# Patient Record
Sex: Female | Born: 1960
Health system: Southern US, Community
[De-identification: ages and names within clinical notes are randomized; demographics above are authoritative.]

## PROBLEM LIST (undated history)

## (undated) DIAGNOSIS — Z87440 Personal history of urinary (tract) infections: Secondary | ICD-10-CM

## (undated) DIAGNOSIS — Z8719 Personal history of other diseases of the digestive system: Secondary | ICD-10-CM

## (undated) DIAGNOSIS — IMO0002 Reserved for concepts with insufficient information to code with codable children: Secondary | ICD-10-CM

## (undated) HISTORY — DX: Personal history of other diseases of the digestive system: Z87.19

## (undated) HISTORY — DX: Reserved for concepts with insufficient information to code with codable children: IMO0002

## (undated) HISTORY — PX: LAPAROSCOPY: SHX197

## (undated) HISTORY — DX: Personal history of urinary (tract) infections: Z87.440

## (undated) HISTORY — PX: WISDOM TOOTH EXTRACTION: SHX21

---

## 1995-08-14 HISTORY — PX: ABDOMINAL HYSTERECTOMY: SHX81

## 1999-11-01 ENCOUNTER — Encounter: Admission: RE | Admit: 1999-11-01 | Discharge: 1999-11-01 | Payer: Self-pay | Admitting: *Deleted

## 2000-07-15 ENCOUNTER — Encounter: Admission: RE | Admit: 2000-07-15 | Discharge: 2000-07-15 | Payer: Self-pay | Admitting: *Deleted

## 2005-09-17 ENCOUNTER — Ambulatory Visit: Payer: Self-pay | Admitting: Family Medicine

## 2006-04-19 ENCOUNTER — Other Ambulatory Visit: Admission: RE | Admit: 2006-04-19 | Discharge: 2006-04-19 | Payer: Self-pay | Admitting: Internal Medicine

## 2006-11-19 ENCOUNTER — Other Ambulatory Visit: Admission: RE | Admit: 2006-11-19 | Discharge: 2006-11-19 | Payer: Self-pay | Admitting: Internal Medicine

## 2007-05-16 ENCOUNTER — Other Ambulatory Visit: Admission: RE | Admit: 2007-05-16 | Discharge: 2007-05-16 | Payer: Self-pay | Admitting: Internal Medicine

## 2009-08-13 HISTORY — PX: KNEE SURGERY: SHX244

## 2010-02-24 ENCOUNTER — Ambulatory Visit: Payer: Self-pay | Admitting: Family Medicine

## 2010-05-10 ENCOUNTER — Other Ambulatory Visit: Admission: RE | Admit: 2010-05-10 | Discharge: 2010-05-10 | Payer: Self-pay | Admitting: Internal Medicine

## 2011-02-19 ENCOUNTER — Ambulatory Visit: Payer: Self-pay | Admitting: Family Medicine

## 2011-07-23 ENCOUNTER — Ambulatory Visit: Payer: Self-pay | Admitting: Family Medicine

## 2012-01-24 ENCOUNTER — Ambulatory Visit: Payer: Self-pay | Admitting: Family Medicine

## 2012-08-19 ENCOUNTER — Ambulatory Visit: Payer: Self-pay | Admitting: Family Medicine

## 2012-08-20 ENCOUNTER — Ambulatory Visit: Payer: Self-pay | Admitting: Family Medicine

## 2012-10-09 ENCOUNTER — Encounter: Payer: Self-pay | Admitting: Internal Medicine

## 2012-10-09 LAB — HM PAP SMEAR

## 2012-11-19 ENCOUNTER — Encounter: Payer: Self-pay | Admitting: Internal Medicine

## 2012-12-04 ENCOUNTER — Ambulatory Visit (AMBULATORY_SURGERY_CENTER): Payer: 59 | Admitting: *Deleted

## 2012-12-04 VITALS — Ht 62.0 in | Wt 132.8 lb

## 2012-12-04 DIAGNOSIS — Z1211 Encounter for screening for malignant neoplasm of colon: Secondary | ICD-10-CM

## 2012-12-04 MED ORDER — MOVIPREP 100 G PO SOLR: 1.0000 | Freq: Once | ORAL | Status: DC

## 2012-12-04 NOTE — Progress Notes (Signed)
NO EGG OR SOY ALLERGY. EWM PT STATES OCCASIONALLY HAS TROUBLE WAKING FROM SEDATION AND SHE CRIES WITH SEDATION AS WELL. EWM

## 2012-12-18 ENCOUNTER — Ambulatory Visit (AMBULATORY_SURGERY_CENTER): Payer: 59 | Admitting: Internal Medicine

## 2012-12-18 ENCOUNTER — Encounter: Payer: Self-pay | Admitting: Internal Medicine

## 2012-12-18 VITALS — BP 101/63 | HR 55 | Temp 97.1°F | Resp 13 | Ht 62.0 in | Wt 132.0 lb

## 2012-12-18 DIAGNOSIS — Z1211 Encounter for screening for malignant neoplasm of colon: Secondary | ICD-10-CM

## 2012-12-18 MED ORDER — SODIUM CHLORIDE 0.9 % IV SOLN: 500.0000 mL | INTRAVENOUS | Status: DC

## 2012-12-18 NOTE — Progress Notes (Signed)
Patient did not experience any of the following events: a burn prior to discharge; a fall within the facility; wrong site/side/patient/procedure/implant event; or a hospital transfer or hospital admission upon discharge from the facility. (G8907) Patient did not have preoperative order for IV antibiotic SSI prophylaxis. (G8918)  

## 2012-12-18 NOTE — Op Note (Signed)
[facility] [address] [place], [address]   COLONOSCOPY PROCEDURE REPORT  PATIENT: Christal, Lagerstrom  MR#: [id number] BIRTHDATE: [birth year] , 51  yrs. old GENDER: Female ENDOSCOPIST: Beverley Fiedler, MD REFERRED BY: PROCEDURE DATE:  12/18/2012 PROCEDURE:   Colonoscopy, screening ASA CLASS:   Class I INDICATIONS:average risk screening and first colonoscopy. MEDICATIONS: MAC sedation, administered by CRNA and propofol (Diprivan) 300mg  IV  DESCRIPTION OF PROCEDURE:   After the risks benefits and alternatives of the procedure were thoroughly explained, informed consent was obtained.  A digital rectal exam revealed no rectal mass.   The LB [id number]-[id number]  endoscope was introduced through the anus and advanced to the cecum, which was identified by both the appendix and ileocecal valve. No adverse events experienced. The quality of the prep was good, using MoviPrep  The instrument was then slowly withdrawn as the colon was fully examined.   COLON FINDINGS: There was mild scattered diverticulosis noted in the descending colon and sigmoid colon.   The colon was otherwise normal.  There was no inflammation, polyps or cancers seen. Retroflexed views revealed small internal hemorrhoids. The time to cecum=1 minutes 41 seconds.  Withdrawal time=9 minutes 02 seconds. The scope was withdrawn and the procedure completed.  COMPLICATIONS: There were no complications.  ENDOSCOPIC IMPRESSION: 1.   There was mild diverticulosis noted in the descending colon and sigmoid colon 2.   The colon was otherwise normal 3.   Small internal hemorrhoids  RECOMMENDATIONS: 1.  High fiber diet 2.  You should continue to follow colorectal cancer screening guidelines for "routine risk" patients with a repeat colonoscopy in 10 years.  There is no need for FOBT (stool) testing for at least 5 years.   eSigned:  Beverley Fiedler, MD 12/18/2012 9:27 AM   cc: The Patient

## 2012-12-18 NOTE — Progress Notes (Signed)
Report to pacu rn vss, bbs=clear 

## 2012-12-18 NOTE — Progress Notes (Signed)
Scope changed 571 to 0528 colon.

## 2012-12-18 NOTE — Patient Instructions (Addendum)
YOU HAD AN ENDOSCOPIC PROCEDURE TODAY AT THE [facility]: Refer to the procedure report that was given to you for any specific questions about what was found during the examination.  If the procedure report does not answer your questions, please call your gastroenterologist to clarify.  If you requested that your care partner not be given the details of your procedure findings, then the procedure report has been included in a sealed envelope for you to review at your convenience later.  YOU SHOULD EXPECT: Some feelings of bloating in the abdomen. Passage of more gas than usual.  Walking can help get rid of the air that was put into your GI tract during the procedure and reduce the bloating. If you had a lower endoscopy (such as a colonoscopy or flexible sigmoidoscopy) you may notice spotting of blood in your stool or on the toilet paper. If you underwent a bowel prep for your procedure, then you may not have a normal bowel movement for a few days.  DIET: Your first meal following the procedure should be a light meal and then it is ok to progress to your normal diet.  A half-sandwich or bowl of soup is an example of a good first meal.  Heavy or fried foods are harder to digest and may make you feel nauseous or bloated.  Likewise meals heavy in dairy and vegetables can cause extra gas to form and this can also increase the bloating.  Drink plenty of fluids but you should avoid alcoholic beverages for 24 hours.  ACTIVITY: Your care partner should take you home directly after the procedure.  You should plan to take it easy, moving slowly for the rest of the day.  You can resume normal activity the day after the procedure however you should NOT DRIVE or use heavy machinery for 24 hours (because of the sedation medicines used during the test).    SYMPTOMS TO REPORT IMMEDIATELY: A gastroenterologist can be reached at any hour.  During normal business hours, 8:30 AM to 5:00 PM Monday through Friday,  call [phone].  After hours and on weekends, please call the GI answering service at [phone] who will take a message and have the physician on call contact you.   Following lower endoscopy (colonoscopy or flexible sigmoidoscopy):  Excessive amounts of blood in the stool  Significant tenderness or worsening of abdominal pains  Swelling of the abdomen that is new, acute  Fever of 100F or higher   FOLLOW UP: If any biopsies were taken you will be contacted by phone or by letter within the next 1-3 weeks.  Call your gastroenterologist if you have not heard about the biopsies in 3 weeks.  Our staff will call the home number listed on your records the next business day following your procedure to check on you and address any questions or concerns that you may have at that time regarding the information given to you following your procedure. This is a courtesy call and so if there is no answer at the home number and we have not heard from you through the emergency physician on call, we will assume that you have returned to your regular daily activities without incident.  SIGNATURES/CONFIDENTIALITY: You and/or your care partner have signed paperwork which will be entered into your electronic medical record.  These signatures attest to the fact that that the information above on your After Visit Summary has been reviewed and is understood.  Full responsibility of the confidentiality of   this discharge information lies with you and/or your care-partner.  Diverticulosis, high fiber diet, hemorrhoids-handouts given  Repeat colonoscopy in 10 years.  

## 2012-12-19 ENCOUNTER — Telehealth: Payer: Self-pay | Admitting: *Deleted

## 2012-12-19 NOTE — Telephone Encounter (Signed)
  Follow up Call-  Call back number 12/18/2012  Post procedure Call Back phone  # [phone]  Permission to leave phone message Yes     Patient questions:  Do you have a fever, pain , or abdominal swelling? no Pain Score  0 *  Have you tolerated food without any problems? yes  Have you been able to return to your normal activities? yes  Do you have any questions about your discharge instructions: Diet   no Medications  no Follow up visit  no  Do you have questions or concerns about your Care? no  Actions: * If pain score is 4 or above: No action needed, pain <4.

## 2013-04-23 ENCOUNTER — Ambulatory Visit: Payer: 59 | Admitting: Internal Medicine

## 2013-06-17 ENCOUNTER — Encounter: Payer: Self-pay | Admitting: Internal Medicine

## 2013-06-17 ENCOUNTER — Ambulatory Visit (INDEPENDENT_AMBULATORY_CARE_PROVIDER_SITE_OTHER): Payer: BC Managed Care – PPO | Admitting: Internal Medicine

## 2013-06-17 VITALS — BP 100/70 | HR 63 | Temp 98.1°F | Ht 62.75 in | Wt 135.0 lb

## 2013-06-17 DIAGNOSIS — Z13220 Encounter for screening for lipoid disorders: Secondary | ICD-10-CM

## 2013-06-17 DIAGNOSIS — K5790 Diverticulosis of intestine, part unspecified, without perforation or abscess without bleeding: Secondary | ICD-10-CM

## 2013-06-17 DIAGNOSIS — Z23 Encounter for immunization: Secondary | ICD-10-CM

## 2013-06-17 DIAGNOSIS — M549 Dorsalgia, unspecified: Secondary | ICD-10-CM

## 2013-06-17 DIAGNOSIS — N809 Endometriosis, unspecified: Secondary | ICD-10-CM

## 2013-06-17 DIAGNOSIS — F439 Reaction to severe stress, unspecified: Secondary | ICD-10-CM

## 2013-06-17 DIAGNOSIS — Z733 Stress, not elsewhere classified: Secondary | ICD-10-CM

## 2013-06-17 DIAGNOSIS — K5730 Diverticulosis of large intestine without perforation or abscess without bleeding: Secondary | ICD-10-CM

## 2013-06-17 DIAGNOSIS — N951 Menopausal and female climacteric states: Secondary | ICD-10-CM

## 2013-06-17 NOTE — Progress Notes (Signed)
Pre-visit discussion using our clinic review tool. No additional management support is needed unless otherwise documented below in the visit note.  

## 2013-06-21 ENCOUNTER — Encounter: Payer: Self-pay | Admitting: Internal Medicine

## 2013-06-21 ENCOUNTER — Other Ambulatory Visit: Payer: Self-pay | Admitting: Internal Medicine

## 2013-06-21 DIAGNOSIS — F439 Reaction to severe stress, unspecified: Secondary | ICD-10-CM | POA: Insufficient documentation

## 2013-06-21 DIAGNOSIS — M549 Dorsalgia, unspecified: Secondary | ICD-10-CM | POA: Insufficient documentation

## 2013-06-21 DIAGNOSIS — K5790 Diverticulosis of intestine, part unspecified, without perforation or abscess without bleeding: Secondary | ICD-10-CM | POA: Insufficient documentation

## 2013-06-21 DIAGNOSIS — N809 Endometriosis, unspecified: Secondary | ICD-10-CM | POA: Insufficient documentation

## 2013-06-21 DIAGNOSIS — N951 Menopausal and female climacteric states: Secondary | ICD-10-CM | POA: Insufficient documentation

## 2013-06-21 MED ORDER — ESTRADIOL 1.53 MG/SPRAY TD SOLN: 2.0000 | Freq: Every day | TRANSDERMAL | Status: DC

## 2013-06-21 NOTE — Assessment & Plan Note (Signed)
Previous MRI as outlined.  Stable.  Exercises and stretches.  Follow.

## 2013-06-21 NOTE — Progress Notes (Signed)
Subjective:    Patient ID: Erica Robinson, female    DOB: 1961/03/02, 52 y.o.   MRN: [id number]  HPI 52 year old female with past history of back pain (with L-5 - bulging disc), diverticulosis and endometriosis.  She comes in today to follow up on these issues as well as to establish care.  She has previously been seeing Thurston Pounds at [facility].  Also previously worked at [employer] and was evaluated there.  She now works at [employer] in [place].  Stress is better.  No acid reflux.  Does have some problems with her left side/leg - being numb.  Had MRI of her back.  Revealed bulging disc at L5.  Has seen a chiropractor.  Has a "decompression" every 3-6 months.  Overall stable.  Controls with exercise/stretching.  Notices some intermittent left lower extremity swelling.  No weakness of the leg.  Eating and drinking well.  Plans to get back in to her exercise routine.  Has been taking care of her mother.  She recently passed away.  Feels she is coping relatively well.  Some issues with postmenopausal issues.  Did not tolerate oral estrogen or the patch.  She is on the evamist now and tolerating.  We discussed her family history of breast cancer and ovarian cancer.  Discussed risks and side effects of estrogen therapy.  She is trying to titrate down her estrogen.  Overall she feel she is doing relatively well.     Past Medical History  Diagnosis Date  . Hx of diverticulitis of colon   . Hx: UTI (urinary tract infection)   . Bulging disc     L5    Outpatient Encounter Prescriptions as of 06/17/2013  Medication Sig  . Calcium-Vitamin D-Vitamin K (CHEWABLE CALCIUM PO) Take 1,000 mg by mouth daily.  . Cholecalciferol (VITAMIN D-3 PO) Take 5,000 Units by mouth 2 (two) times a week.  . estradiol (EVAMIST) 1.53 MG/SPRAY transdermal spray Place 2 sprays onto the skin daily.  . Multiple Vitamins-Minerals (MULTIVITAMIN PO) Take 1 capsule by mouth daily.  Marland Kitchen VITAMIN E  PO Take 1 capsule by mouth daily.    Review of Systems Patient denies any headache, lightheadedness or dizziness.  No significant sinus or allergy symptoms.  No chest pain, tightness or palpitations.  No increased shortness of breath, cough or congestion.  No nausea or vomiting.  No abdominal pain or cramping.  No bowel change, such as diarrhea, constipation, BRBPR or melana.  No urine change.  Stress is better.  Some post menopausal symptoms.  Some hot flashes.  Better on evamist.  Left side pain and numbness as outlined.  Back issues as outlined.         Objective:   Physical Exam Filed Vitals:   06/17/13 0824  BP: 100/70  Pulse: 63  Temp: 98.1 F (6.10 C)   52 year old female in no acute distress.   HEENT:  Nares- clear.  Oropharynx - without lesions. NECK:  Supple.  Nontender.  No audible bruit.  HEART:  Appears to be regular. LUNGS:  No crackles or wheezing audible.  Respirations even and unlabored.  RADIAL PULSE:  Equal bilaterally.    ABDOMEN:  Soft, nontender.  Bowel sounds present and normal.  No audible abdominal bruit.   EXTREMITIES:  No increased edema present.  DP pulses palpable and equal bilaterally.          Assessment & Plan:  HEALTH MAINTENANCE.  Will schedule  her for a physical.  Obtain outside records.  Last mammogram per her report 2/14 - ok.   Had colonoscopy in 5/14.  Recommended f/u in 10 years.    I spent 45 minutes with the patient and more than 50% of the time was spent in consultation regarding the above.

## 2013-06-21 NOTE — Assessment & Plan Note (Signed)
Feels she is doing better.  Coping with her mother's death.  Has changed jobs.  Stress better.  Follow.   

## 2013-06-21 NOTE — Assessment & Plan Note (Signed)
Colonoscopy 12/2012.  Recommended f/u in 10 years.  Bowels stable.   

## 2013-06-21 NOTE — Assessment & Plan Note (Signed)
Doing well on the evamist.  Discussed risk and side effects of estrogen.  Has family history of breast and ovarian cancer.  Her sister had negative genetic testing.  Last mammogram 09/2012.  Obtain records.

## 2013-06-21 NOTE — Progress Notes (Signed)
Refilled evamist

## 2013-06-21 NOTE — Assessment & Plan Note (Signed)
S/p hysterectomy.  Doing well.  Follow.  

## 2013-07-29 ENCOUNTER — Encounter: Payer: Self-pay | Admitting: Internal Medicine

## 2013-09-21 ENCOUNTER — Ambulatory Visit: Payer: Self-pay | Admitting: Internal Medicine

## 2013-09-21 LAB — HM MAMMOGRAPHY: HM Mammogram: NEGATIVE

## 2013-09-22 ENCOUNTER — Encounter: Payer: Self-pay | Admitting: Internal Medicine

## 2013-10-31 ENCOUNTER — Other Ambulatory Visit: Payer: Self-pay | Admitting: Internal Medicine

## 2013-12-04 ENCOUNTER — Encounter: Payer: Self-pay | Admitting: Internal Medicine

## 2013-12-04 ENCOUNTER — Ambulatory Visit (INDEPENDENT_AMBULATORY_CARE_PROVIDER_SITE_OTHER): Payer: BC Managed Care – PPO | Admitting: Internal Medicine

## 2013-12-04 VITALS — BP 110/70 | HR 62 | Temp 98.0°F | Ht 61.25 in | Wt 136.0 lb

## 2013-12-04 DIAGNOSIS — K5790 Diverticulosis of intestine, part unspecified, without perforation or abscess without bleeding: Secondary | ICD-10-CM

## 2013-12-04 DIAGNOSIS — K5730 Diverticulosis of large intestine without perforation or abscess without bleeding: Secondary | ICD-10-CM

## 2013-12-04 DIAGNOSIS — F439 Reaction to severe stress, unspecified: Secondary | ICD-10-CM

## 2013-12-04 DIAGNOSIS — N809 Endometriosis, unspecified: Secondary | ICD-10-CM

## 2013-12-04 DIAGNOSIS — N951 Menopausal and female climacteric states: Secondary | ICD-10-CM

## 2013-12-04 DIAGNOSIS — Z0000 Encounter for general adult medical examination without abnormal findings: Secondary | ICD-10-CM

## 2013-12-04 DIAGNOSIS — M549 Dorsalgia, unspecified: Secondary | ICD-10-CM

## 2013-12-04 DIAGNOSIS — Z733 Stress, not elsewhere classified: Secondary | ICD-10-CM

## 2013-12-04 NOTE — Progress Notes (Signed)
Pre visit review using our clinic review tool, if applicable. No additional management support is needed unless otherwise documented below in the visit note. 

## 2013-12-04 NOTE — Progress Notes (Signed)
Subjective:    Patient ID: Erica Robinson, female    DOB: 1961-07-05, 53 y.o.   MRN: [id number]  HPI 53 year old female with past history of back pain (with L-5 - bulging disc), diverticulosis and endometriosis.  She comes in today to follow up on these issues as well as for a complete physical exam.    She now works at [employer] in [place].  Stress is better.  No acid reflux.  Does have some problems with her left side/leg - being numb.  Had MRI of her back.  Revealed bulging disc at L5.  Has seen a chiropractor.  Has a "decompression" every 3-6 months.  Overall stable.  Back actually is better since exercising.   Controls with exercise/stretching.  Had previously noticed some intermittent left lower extremity swelling.  This is better.  Eating and drinking well.  Has adjusted her diet.  Feels better.  Seeing a dietician.  Back exercising.   Had been taking care of her mother.  She recently passed away.  Feels she is coping relatively well.  Some issues with postmenopausal issues.  Did not tolerate oral estrogen or the patch.  She is on the evamist now and tolerating.  We discussed her family history of breast cancer and ovarian cancer.  Discussed risks and side effects of estrogen therapy.  She is trying to titrate down her estrogen.  Overall she feel she is doing relatively well.     Past Medical History  Diagnosis Date  . Hx of diverticulitis of colon   . Hx: UTI (urinary tract infection)   . Bulging disc     L5    Outpatient Encounter Prescriptions as of 12/04/2013  Medication Sig  . Calcium-Vitamin D-Vitamin K (CHEWABLE CALCIUM PO) Take 1,000 mg by mouth daily.  . Cholecalciferol (VITAMIN D-3 PO) Take 5,000 Units by mouth 2 (two) times a week.  Marland Kitchen. EVAMIST 1.53 MG/SPRAY transdermal spray PLACE 2 SPRAYS ONTO THE SKIN DAILY.  . Multiple Vitamins-Minerals (MULTIVITAMIN PO) Take 1 capsule by mouth daily.  Marland Kitchen. VITAMIN E PO Take 1 capsule by mouth daily.    Review of Systems Patient  denies any headache, lightheadedness or dizziness.  No significant sinus or allergy symptoms.  No chest pain, tightness or palpitations.  No increased shortness of breath, cough or congestion.  No nausea or vomiting.  No abdominal pain or cramping.  No bowel change, such as diarrhea, constipation, BRBPR or melana.  No urine change.  Stress is better.  Some post menopausal symptoms.  Some hot flashes.  Better on evamist.  Back better with exercising.  Has adjusted her diet.  Feels better.           Objective:   Physical Exam  Filed Vitals:   12/04/13 1319  BP: 110/70  Pulse: 62  Temp: 98 F (2236.527 C)   53 year old female in no acute distress.   HEENT:  Nares- clear.  Oropharynx - without lesions. NECK:  Supple.  Nontender.  No audible bruit.  HEART:  Appears to be regular. LUNGS:  No crackles or wheezing audible.  Respirations even and unlabored.  RADIAL PULSE:  Equal bilaterally.    BREASTS:  No nipple discharge or nipple retraction present.  Could not appreciate any distinct nodules or axillary adenopathy.  ABDOMEN:  Soft, nontender.  Bowel sounds present and normal.  No audible abdominal bruit.  GU:  Not performed.    EXTREMITIES:  No increased edema present.  DP pulses  palpable and equal bilaterally.          Assessment & Plan:  DERMATOLOGY.  Desires referral to dermatology for skin survey.   HEALTH MAINTENANCE.  Physical today.   Mammogram 09/21/13 - Birads I.   Had colonoscopy in 5/14.  Recommended f/u in 10 years.    I spent 25 minutes with the patient and more than 50% of the time was spent in consultation regarding the above.

## 2013-12-06 ENCOUNTER — Encounter: Payer: Self-pay | Admitting: Internal Medicine

## 2013-12-06 NOTE — Assessment & Plan Note (Signed)
Colonoscopy 12/2012.  Recommended f/u in 10 years.  Bowels stable.

## 2013-12-06 NOTE — Assessment & Plan Note (Signed)
Doing well on the evamist.  Discussed risk and side effects of estrogen.  Has family history of breast and ovarian cancer.  Her sister had negative genetic testing.  Last mammogram 09/21/13 - Birads I.  Discussed continuing to decrease and taper off estrogen.

## 2013-12-06 NOTE — Assessment & Plan Note (Signed)
S/p hysterectomy.  Doing well.  Follow.  

## 2013-12-06 NOTE — Assessment & Plan Note (Signed)
Feels she is doing better.  Coping with her mother's death.  Has changed jobs.  Stress better.  Follow.

## 2013-12-06 NOTE — Assessment & Plan Note (Signed)
Better with exercise.  Follow.  

## 2013-12-16 ENCOUNTER — Encounter: Payer: BC Managed Care – PPO | Admitting: Internal Medicine

## 2014-03-16 ENCOUNTER — Other Ambulatory Visit: Payer: Self-pay | Admitting: Internal Medicine

## 2014-10-22 ENCOUNTER — Ambulatory Visit: Payer: Self-pay | Admitting: Obstetrics and Gynecology

## 2014-12-07 ENCOUNTER — Encounter: Payer: BC Managed Care – PPO | Admitting: Internal Medicine

## 2015-02-24 ENCOUNTER — Other Ambulatory Visit: Payer: Self-pay | Admitting: Internal Medicine

## 2015-02-24 DIAGNOSIS — Z1231 Encounter for screening mammogram for malignant neoplasm of breast: Secondary | ICD-10-CM

## 2015-03-03 ENCOUNTER — Ambulatory Visit: Payer: Self-pay | Attending: Internal Medicine

## 2016-01-13 ENCOUNTER — Ambulatory Visit
Admission: RE | Admit: 2016-01-13 | Discharge: 2016-01-13 | Disposition: A | Discharge status: Home / Self Care | Payer: Managed Care, Other (non HMO) | Source: Ambulatory Visit | Attending: Obstetrics and Gynecology | Admitting: Obstetrics and Gynecology

## 2016-01-13 DIAGNOSIS — Z1231 Encounter for screening mammogram for malignant neoplasm of breast: Secondary | ICD-10-CM | POA: Insufficient documentation

## 2017-02-04 ENCOUNTER — Other Ambulatory Visit: Payer: Self-pay | Admitting: Obstetrics and Gynecology

## 2017-02-04 DIAGNOSIS — Z1231 Encounter for screening mammogram for malignant neoplasm of breast: Secondary | ICD-10-CM

## 2017-03-01 ENCOUNTER — Ambulatory Visit
Admission: RE | Admit: 2017-03-01 | Discharge: 2017-03-01 | Disposition: A | Discharge status: Home / Self Care | Payer: Managed Care, Other (non HMO) | Source: Ambulatory Visit | Attending: Obstetrics and Gynecology | Admitting: Obstetrics and Gynecology

## 2017-03-01 DIAGNOSIS — Z1231 Encounter for screening mammogram for malignant neoplasm of breast: Secondary | ICD-10-CM | POA: Insufficient documentation

## 2017-11-20 ENCOUNTER — Other Ambulatory Visit: Payer: Self-pay | Admitting: Obstetrics and Gynecology

## 2017-11-20 DIAGNOSIS — Z1231 Encounter for screening mammogram for malignant neoplasm of breast: Secondary | ICD-10-CM

## 2018-03-03 ENCOUNTER — Ambulatory Visit
Admission: RE | Admit: 2018-03-03 | Discharge: 2018-03-03 | Disposition: A | Discharge status: Home / Self Care | Payer: 59 | Source: Ambulatory Visit | Attending: Obstetrics and Gynecology | Admitting: Obstetrics and Gynecology

## 2018-03-03 DIAGNOSIS — Z1231 Encounter for screening mammogram for malignant neoplasm of breast: Secondary | ICD-10-CM

## 2019-02-19 DIAGNOSIS — N952 Postmenopausal atrophic vaginitis: Secondary | ICD-10-CM | POA: Diagnosis not present

## 2019-02-19 DIAGNOSIS — M8580 Other specified disorders of bone density and structure, unspecified site: Secondary | ICD-10-CM | POA: Diagnosis not present

## 2019-02-19 DIAGNOSIS — E559 Vitamin D deficiency, unspecified: Secondary | ICD-10-CM | POA: Diagnosis not present

## 2019-02-19 DIAGNOSIS — Z79890 Hormone replacement therapy (postmenopausal): Secondary | ICD-10-CM | POA: Diagnosis not present

## 2019-02-20 ENCOUNTER — Other Ambulatory Visit: Payer: Self-pay | Admitting: Family Medicine

## 2019-02-20 DIAGNOSIS — Z1231 Encounter for screening mammogram for malignant neoplasm of breast: Secondary | ICD-10-CM

## 2019-04-13 ENCOUNTER — Other Ambulatory Visit: Payer: Self-pay

## 2019-04-13 ENCOUNTER — Ambulatory Visit
Admission: RE | Admit: 2019-04-13 | Discharge: 2019-04-13 | Disposition: A | Discharge status: Home / Self Care | Payer: BC Managed Care – PPO | Source: Ambulatory Visit | Attending: Family Medicine | Admitting: Family Medicine

## 2019-04-13 DIAGNOSIS — Z1231 Encounter for screening mammogram for malignant neoplasm of breast: Secondary | ICD-10-CM

## 2019-04-24 DIAGNOSIS — N952 Postmenopausal atrophic vaginitis: Secondary | ICD-10-CM | POA: Diagnosis not present

## 2019-04-24 DIAGNOSIS — Z79890 Hormone replacement therapy (postmenopausal): Secondary | ICD-10-CM | POA: Diagnosis not present

## 2019-04-24 DIAGNOSIS — E559 Vitamin D deficiency, unspecified: Secondary | ICD-10-CM | POA: Diagnosis not present

## 2019-11-11 DIAGNOSIS — L820 Inflamed seborrheic keratosis: Secondary | ICD-10-CM | POA: Diagnosis not present

## 2019-11-11 DIAGNOSIS — L573 Poikiloderma of Civatte: Secondary | ICD-10-CM | POA: Diagnosis not present

## 2020-01-05 ENCOUNTER — Encounter: Payer: Self-pay | Admitting: Internal Medicine

## 2020-01-05 ENCOUNTER — Other Ambulatory Visit: Payer: Self-pay

## 2020-01-05 ENCOUNTER — Ambulatory Visit: Payer: BC Managed Care – PPO | Admitting: Internal Medicine

## 2020-01-05 DIAGNOSIS — M545 Low back pain, unspecified: Secondary | ICD-10-CM | POA: Insufficient documentation

## 2020-01-05 DIAGNOSIS — G8929 Other chronic pain: Secondary | ICD-10-CM

## 2020-01-05 DIAGNOSIS — N959 Unspecified menopausal and perimenopausal disorder: Secondary | ICD-10-CM | POA: Diagnosis not present

## 2020-01-05 NOTE — Patient Instructions (Signed)
Menopause and Hormone Replacement Therapy Menopause is a normal time of life when menstrual periods stop completely and the ovaries stop producing the female hormones estrogen and progesterone. This lack of hormones can affect your health and cause undesirable symptoms. Hormone replacement therapy (HRT) can relieve some of those symptoms. What is hormone replacement therapy? HRT is the use of artificial (synthetic) hormones to replace hormones that your body has stopped producing because you have reached menopause. What are my options for HRT?  HRT may consist of the synthetic hormones estrogen and progestin, or it may consist of only estrogen (estrogen-only therapy). You and your health care provider will decide which form of HRT is best for you. If you choose to be on HRT and you have a uterus, estrogen and progestin are usually prescribed. Estrogen-only therapy is used for women who do not have a uterus. Possible options for taking HRT include:  Pills.  Patches.  Gels.  Sprays.  Vaginal cream.  Vaginal rings.  Vaginal inserts. The amount of hormone(s) that you take and how long you take the hormone(s) varies according to your health. It is important to:  Begin HRT with the lowest possible dosage.  Stop HRT as soon as your health care provider tells you to stop.  Work with your health care provider so that you feel informed and comfortable with your decisions. What are the benefits of HRT? HRT can reduce the frequency and severity of menopausal symptoms. Benefits of HRT vary according to the kind of symptoms that you have, how severe they are, and your overall health. HRT may help to improve the following symptoms of menopause:  Hot flashes and night sweats. These are sudden feelings of heat that spread over the face and body. The skin may turn red, like a blush. Night sweats are hot flashes that happen while you are sleeping or trying to sleep.  Bone loss (osteoporosis). The  body loses calcium more quickly after menopause, causing the bones to become weaker. This can increase the risk for bone breaks (fractures).  Vaginal dryness. The lining of the vagina can become thin and dry, which can cause pain during sex or cause infection, burning, or itching.  Urinary tract infections.  Urinary incontinence. This is the inability to control when you pass urine.  Irritability.  Short-term memory problems. What are the risks of HRT? Risks of HRT vary depending on your individual health and medical history. Risks of HRT also depend on whether you receive both estrogen and progestin or you receive estrogen only. HRT may increase the risk of:  Spotting. This is when a small amount of blood leaks from the vagina unexpectedly.  Endometrial cancer. This cancer is in the lining of the uterus (endometrium).  Breast cancer.  Increased density of breast tissue. This can make it harder to find breast cancer on a breast X-ray (mammogram).  Stroke.  Heart disease.  Blood clots.  Gallbladder disease.  Liver disease. Risks of HRT can increase if you have any of the following conditions:  Endometrial cancer.  Liver disease.  Heart disease.  Breast cancer.  History of blood clots.  History of stroke. Follow these instructions at home:  Take over-the-counter and prescription medicines only as told by your health care provider.  Get mammograms, pelvic exams, and medical checkups as often as told by your health care provider.  Have Pap tests done as often as told by your health care provider. A Pap test is sometimes called a Pap smear. It   is a screening test that is used to check for signs of cancer of the cervix and vagina. A Pap test can also identify the presence of infection or precancerous changes. Pap tests may be done: ? Every 3 years, starting at age 21. ? Every 5 years, starting after age 30, in combination with testing for human papillomavirus  (HPV). ? More often or less often depending on other medical conditions you have, your age, and other risk factors.  It is up to you to get the results of your Pap test. Ask your health care provider, or the department that is doing the test, when your results will be ready.  Keep all follow-up visits as told by your health care provider. This is important. Contact a health care provider if you have:  Pain or swelling in your legs.  Shortness of breath.  Chest pain.  Lumps or changes in your breasts or armpits.  Slurred speech.  Pain, burning, or bleeding when you urinate.  Unusual vaginal bleeding.  Dizziness or headaches.  Weakness or numbness in any part of your arms or legs.  Pain in your abdomen. Summary  Menopause is a normal time of life when menstrual periods stop completely and the ovaries stop producing the female hormones estrogen and progesterone.  Hormone replacement therapy (HRT) can relieve some of the symptoms of menopause.  HRT can reduce the frequency and severity of menopausal symptoms.  Risks of HRT vary depending on your individual health and medical history. This information is not intended to replace advice given to you by your health care provider. Make sure you discuss any questions you have with your health care provider. Document Revised: 04/01/2018 Document Reviewed: 04/01/2018 Elsevier Patient Education  2020 [employer]  

## 2020-01-05 NOTE — Assessment & Plan Note (Signed)
Continue Estrace 0.5 mg daily Will monitor

## 2020-01-05 NOTE — Assessment & Plan Note (Signed)
Continue stretching and OTC pain meds Will monitor

## 2020-01-05 NOTE — Progress Notes (Signed)
HPI  Pt presents to the clinic today to establish care. She has not had a PCP in many years but has been going to GYN.  Postmenopausal: She reports hot flashes, chronic fatigue and some weight gain. She is taking Estrace as prescribed.   Chronic Low Back Pain: Bulging disc at L5. She takes pain medication OTC, walks as needed with good relief of symptoms.   Flu: 04/2019 Tetanus: unsure Pap Smear: 11/2017 Mammogram: 03/2019 Bone Density: > 2 years ago Colon Screening: 2014,  Vision Screening: as needed Dentist: as needed  Past Medical History:  Diagnosis Date  . Bulging disc    L5  . Hx of diverticulitis of colon   . Hx: UTI (urinary tract infection)     Current Outpatient Medications  Medication Sig Dispense Refill  . Calcium-Vitamin D-Vitamin K (CHEWABLE CALCIUM PO) Take 1,000 mg by mouth daily.    . Cholecalciferol (VITAMIN D-3 PO) Take 5,000 Units by mouth 2 (two) times a week.    Marland Kitchen EVAMIST 1.53 MG/SPRAY transdermal spray PLACE 2 SPRAYS ONTO THE SKIN DAILY. 8.1 spray 0  . EVAMIST 1.53 MG/SPRAY transdermal spray PLACE 2 SPRAYS ONTO THE SKIN DAILY. 8.1 spray 0  . Multiple Vitamins-Minerals (MULTIVITAMIN PO) Take 1 capsule by mouth daily.    Marland Kitchen VITAMIN E PO Take 1 capsule by mouth daily.     No current facility-administered medications for this visit.    Allergies  Allergen Reactions  . Celebrex [Celecoxib] Other (See Comments)    Swelling of joints  . Morphine And Related Other (See Comments)    Severe migraines    Family History  Problem Relation Age of Onset  . Hyperlipidemia Mother   . Heart disease Mother   . Hypertension Mother   . Kidney disease Mother   . Diabetes Mother   . Hyperlipidemia Father   . Colon polyps Father   . Breast cancer Sister   . Colon cancer Neg Hx   . Rectal cancer Neg Hx   . Stomach cancer Neg Hx     Social History   Socioeconomic History  . Marital status: Married    Spouse name: Not on file  . Number of children: 2  . Years  of education: Not on file  . Highest education level: Not on file  Occupational History    Employer: [employer]  Tobacco Use  . Smoking status: Never Smoker  . Smokeless tobacco: Never Used  Substance and Sexual Activity  . Alcohol use: No  . Drug use: No  . Sexual activity: Not on file  Other Topics Concern  . Not on file  Social History Narrative  . Not on file   Social Determinants of Health   Financial Resource Strain:   . Difficulty of Paying Living Expenses:   Food Insecurity:   . Worried About Programme researcher, broadcasting/film/video in the Last Year:   . Barista in the Last Year:   Transportation Needs:   . Freight forwarder (Medical):   Marland Kitchen Lack of Transportation (Non-Medical):   Physical Activity:   . Days of Exercise per Week:   . Minutes of Exercise per Session:   Stress:   . Feeling of Stress :   Social Connections:   . Frequency of Communication with Friends and Family:   . Frequency of Social Gatherings with Friends and Family:   . Attends Religious Services:   . Active Member of Clubs or Organizations:   . Attends Club  or Organization Meetings:   Marland Kitchen Marital Status:   Intimate Partner Violence:   . Fear of Current or Ex-Partner:   . Emotionally Abused:   Marland Kitchen Physically Abused:   . Sexually Abused:     ROS:  Constitutional: Denies fever, malaise, fatigue, headache or abrupt weight changes.  HEENT: Denies eye pain, eye redness, ear pain, ringing in the ears, wax buildup, runny nose, nasal congestion, bloody nose, or sore throat. Respiratory: Denies difficulty breathing, shortness of breath, cough or sputum production.   Cardiovascular: Denies chest pain, chest tightness, palpitations or swelling in the hands or feet.  Gastrointestinal: Denies abdominal pain, bloating, constipation, diarrhea or blood in the stool.  GU: Pt reports vaginal dryness. Denies frequency, urgency, pain with urination, blood in urine, odor or discharge. Musculoskeletal: Pt  reports intermittent low back pain. Denies decrease in range of motion, difficulty with gait, or joint swelling.  Skin: Denies redness, rashes, lesions or ulcercations.  Neurological: Pt reports hot flashes. Denies dizziness, difficulty with memory, difficulty with speech or problems with balance and coordination.  Psych: Denies anxiety, depression, SI/HI.  No other specific complaints in a complete review of systems (except as listed in HPI above).  PE:  BP 110/76   Pulse 65   Temp 97.8 F (36.6 C) (Temporal)   Ht 5\' 1"  (1.549 m)   Wt 152 lb (68.9 kg)   SpO2 95%   BMI 28.72 kg/m   Wt Readings from Last 3 Encounters:  12/04/13 136 lb (61.7 kg)  06/17/13 135 lb (61.2 kg)  12/18/12 132 lb (59.9 kg)    General: Appears her stated age, well developed, well nourished in NAD. Skin: Dry and intact. HEENT: Head: normal shape and size; Eyes: sclera white, no icterus, conjunctiva pink, PERRLA and EOMs intact;  Cardiovascular: Normal rate. Pulmonary/Chest: Normal effort. Musculoskeletal:  No difficulty with gait.  Neurological: Alert and oriented. Psychiatric: Mood and affect normal. Behavior is normal. Judgment and thought content normal.     Assessment and Plan:  Webb Silversmith, NP This visit occurred during the SARS-CoV-2 public health emergency.  Safety protocols were in place, including screening questions prior to the visit, additional usage of staff PPE, and extensive cleaning of exam room while observing appropriate contact time as indicated for disinfecting solutions.

## 2020-02-02 ENCOUNTER — Encounter: Payer: Self-pay | Admitting: Internal Medicine

## 2020-02-04 MED ORDER — ESTRADIOL 1 MG PO TABS: 1.0000 mg | ORAL_TABLET | Freq: Every day | ORAL | 5 refills | Status: DC

## 2020-02-04 NOTE — Telephone Encounter (Signed)
Pt left v/m requesting cb about my chart messages sent already about estradiolol. [facility] [address].

## 2020-05-10 ENCOUNTER — Other Ambulatory Visit: Payer: Self-pay | Admitting: Internal Medicine

## 2020-05-10 DIAGNOSIS — Z1231 Encounter for screening mammogram for malignant neoplasm of breast: Secondary | ICD-10-CM

## 2020-06-17 ENCOUNTER — Ambulatory Visit
Admission: RE | Admit: 2020-06-17 | Discharge: 2020-06-17 | Disposition: A | Discharge status: Home / Self Care | Payer: BC Managed Care – PPO | Source: Ambulatory Visit | Attending: Internal Medicine | Admitting: Internal Medicine

## 2020-06-17 ENCOUNTER — Other Ambulatory Visit: Payer: Self-pay

## 2020-06-17 DIAGNOSIS — Z1231 Encounter for screening mammogram for malignant neoplasm of breast: Secondary | ICD-10-CM

## 2020-07-14 ENCOUNTER — Encounter: Payer: BC Managed Care – PPO | Admitting: Internal Medicine

## 2020-08-15 ENCOUNTER — Other Ambulatory Visit: Payer: Self-pay | Admitting: Internal Medicine

## 2020-09-06 ENCOUNTER — Encounter: Payer: BC Managed Care – PPO | Admitting: Internal Medicine

## 2020-11-03 ENCOUNTER — Other Ambulatory Visit: Payer: Self-pay

## 2020-11-03 ENCOUNTER — Ambulatory Visit (INDEPENDENT_AMBULATORY_CARE_PROVIDER_SITE_OTHER): Payer: BC Managed Care – PPO | Admitting: Internal Medicine

## 2020-11-03 ENCOUNTER — Encounter: Payer: Self-pay | Admitting: Internal Medicine

## 2020-11-03 VITALS — BP 108/70 | HR 63 | Temp 98.1°F | Ht 61.5 in | Wt 151.0 lb

## 2020-11-03 DIAGNOSIS — N959 Unspecified menopausal and perimenopausal disorder: Secondary | ICD-10-CM

## 2020-11-03 DIAGNOSIS — G8929 Other chronic pain: Secondary | ICD-10-CM

## 2020-11-03 DIAGNOSIS — M545 Low back pain, unspecified: Secondary | ICD-10-CM | POA: Diagnosis not present

## 2020-11-03 DIAGNOSIS — Z1159 Encounter for screening for other viral diseases: Secondary | ICD-10-CM

## 2020-11-03 DIAGNOSIS — Z114 Encounter for screening for human immunodeficiency virus [HIV]: Secondary | ICD-10-CM

## 2020-11-03 DIAGNOSIS — Z0001 Encounter for general adult medical examination with abnormal findings: Secondary | ICD-10-CM

## 2020-11-03 LAB — COMPREHENSIVE METABOLIC PANEL
ALT: 12 U/L (ref 0–35)
AST: 18 U/L (ref 0–37)
Albumin: 4.6 g/dL (ref 3.5–5.2)
Alkaline Phosphatase: 59 U/L (ref 39–117)
BUN: 21 mg/dL (ref 6–23)
CO2: 31 mEq/L (ref 19–32)
Calcium: 9.2 mg/dL (ref 8.4–10.5)
Chloride: 105 mEq/L (ref 96–112)
Creatinine, Ser: 0.79 mg/dL (ref 0.40–1.20)
GFR: 81.2 mL/min (ref >60.00)
Glucose, Bld: 100 mg/dL — ABNORMAL HIGH (ref 70–99)
Potassium: 4 mEq/L (ref 3.5–5.1)
Sodium: 143 mEq/L (ref 135–145)
Total Bilirubin: 0.3 mg/dL (ref 0.2–1.2)
Total Protein: 6.7 g/dL (ref 6.0–8.3)

## 2020-11-03 LAB — CBC
HCT: 39.2 % (ref 36.0–46.0)
Hemoglobin: 13.2 g/dL (ref 12.0–15.0)
MCHC: 33.8 g/dL (ref 30.0–36.0)
MCV: 88.9 fl (ref 78.0–100.0)
Platelets: 225 10*3/uL (ref 150.0–400.0)
RBC: 4.4 Mil/uL (ref 3.87–5.11)
RDW: 13 % (ref 11.5–15.5)
WBC: 3.6 10*3/uL — ABNORMAL LOW (ref 4.0–10.5)

## 2020-11-03 LAB — LIPID PANEL
Cholesterol: 234 mg/dL — ABNORMAL HIGH (ref 0–200)
HDL: 87.1 mg/dL (ref >39.00)
LDL Cholesterol: 136 mg/dL — ABNORMAL HIGH (ref 0–99)
NonHDL: 147.26
Total CHOL/HDL Ratio: 3
Triglycerides: 56 mg/dL (ref 0.0–149.0)
VLDL: 11.2 mg/dL (ref 0.0–40.0)

## 2020-11-03 LAB — HEMOGLOBIN A1C: Hgb A1c MFr Bld: 5.7 % (ref 4.6–6.5)

## 2020-11-03 LAB — TSH: TSH: 1.96 u[IU]/mL (ref 0.35–4.50)

## 2020-11-03 NOTE — Patient Instructions (Signed)

## 2020-11-03 NOTE — Progress Notes (Signed)
Subjective:    Patient ID: Erica Robinson, female    DOB: 04/25/1961, 60 y.o.   MRN: [id number]  HPI  Pt presents to the clinic today for her annual exam. She is also due to follow up chronic conditions.  Chronic Low Back Pain: Improved after getting a new mattress. She takes Ibuprofen as needed with good relief of symptoms.  Postmenopausal Symptoms: Mainly hot flashes and vaginal dryness. She is taking Estradiol (1/2 tab daily). She is trying to wean herself off of this medication.  Flu: 04/2020 Tetanus: ? 2014 Covid: Pfizer x 2 Zostovax: 02/2013 Shingrix: never Pap Smear: 11/2017 Mammogram: 06/2020 Bone Density: < 5 years Colon Screening: 12/2012 Vision Screening: as needed Dentist: biannually  Diet: She does eat lean meat. She consumes fruits and veggies. She tries to avoid fried foods. She drinks mostly coffee, water. Exercise: None  Review of Systems      Past Medical History:  Diagnosis Date  . Bulging disc    L5    Current Outpatient Medications  Medication Sig Dispense Refill  . Cholecalciferol (VITAMIN D3) 50 MCG (2000 UT) CHEW Chew by mouth.    . estradiol (ESTRACE) 1 MG tablet TAKE 1 TABLET(1 MG) BY MOUTH DAILY 30 tablet 5  . Multiple Vitamin (MULTIVITAMIN) tablet Take 1 tablet by mouth daily.    . Multiple Vitamins-Minerals (HAIR SKIN AND NAILS FORMULA PO) Take by mouth.     No current facility-administered medications for this visit.    Allergies  Allergen Reactions  . Celebrex [Celecoxib] Other (See Comments)    Swelling of joints  . Morphine And Related Other (See Comments)    Severe migraines    Family History  Problem Relation Age of Onset  . Hyperlipidemia Mother   . Heart disease Mother   . Hypertension Mother   . Kidney disease Mother   . Diabetes Mother   . Hyperlipidemia Father   . Colon polyps Father   . Arthritis Father   . Breast cancer Sister   . Parkinson's disease Maternal Grandmother   . Hyperlipidemia Sister   .  Hyperlipidemia Sister   . Diabetes Sister   . Colon cancer Neg Hx   . Rectal cancer Neg Hx   . Stomach cancer Neg Hx     Social History   Socioeconomic History  . Marital status: Married    Spouse name: Not on file  . Number of children: 2  . Years of education: Not on file  . Highest education level: Not on file  Occupational History    Employer: [employer]  Tobacco Use  . Smoking status: Never Smoker  . Smokeless tobacco: Never Used  Substance and Sexual Activity  . Alcohol use: No  . Drug use: No  . Sexual activity: Not on file  Other Topics Concern  . Not on file  Social History Narrative  . Not on file   Social Determinants of Health   Financial Resource Strain: Not on file  Food Insecurity: Not on file  Transportation Needs: Not on file  Physical Activity: Not on file  Stress: Not on file  Social Connections: Not on file  Intimate Partner Violence: Not on file     Constitutional: Denies fever, malaise, fatigue, headache or abrupt weight changes.  HEENT: Denies eye pain, eye redness, ear pain, ringing in the ears, wax buildup, runny nose, nasal congestion, bloody nose, or sore throat. Respiratory: Denies difficulty breathing, shortness of breath, cough or sputum production.  Cardiovascular: Denies chest pain, chest tightness, palpitations or swelling in the hands or feet.  Gastrointestinal: Denies abdominal pain, bloating, constipation, diarrhea or blood in the stool.  GU: Denies urgency, frequency, pain with urination, burning sensation, blood in urine, odor or discharge. Musculoskeletal: Denies decrease in range of motion, difficulty with gait, muscle pain or joint pain and swelling.  Skin: Denies redness, rashes, lesions or ulcercations.  Neurological: Denies dizziness, difficulty with memory, difficulty with speech or problems with balance and coordination.  Psych: Denies anxiety, depression, SI/HI.  No other specific complaints in a  complete review of systems (except as listed in HPI above).  Objective:   Physical Exam  BP 108/70   Pulse 63   Temp 98.1 F (36.7 C) (Temporal)   Ht 5' 1.5" (1.562 m)   Wt 151 lb (68.5 kg)   SpO2 98%   BMI 28.07 kg/m    Wt Readings from Last 3 Encounters:  01/05/20 152 lb (68.9 kg)  12/04/13 136 lb (61.7 kg)  06/17/13 135 lb (61.2 kg)    General: Appears her stated age, well developed, well nourished in NAD. Skin: Warm, dry and intact. No rashes noted. HEENT: Head: normal shape and size; Eyes: sclera white, no icterus, conjunctiva pink, PERRLA and EOMs intact;  Neck:  Neck supple, trachea midline. No masses, lumps or thyromegaly present.  Cardiovascular: Normal rate and rhythm. S1,S2 noted.  No murmur, rubs or gallops noted. No JVD or BLE edema. No carotid bruits noted. Pulmonary/Chest: Normal effort and positive vesicular breath sounds. No respiratory distress. No wheezes, rales or ronchi noted.  Abdomen: Soft and nontender. Normal bowel sounds. No distention or masses noted. Liver, spleen and kidneys non palpable. Musculoskeletal: Strength 5/5 BUE/BLE. No difficulty with gait.  Neurological: Alert and oriented. Cranial nerves II-XII grossly intact. Coordination normal.  Psychiatric: Mood and affect normal. Behavior is normal. Judgment and thought content normal.        Assessment & Plan:   Preventative Health Maintenance:  Flu shot UTD Tetanus UTD per her report. She will get one if she gets cut/bitten by an animal Encouraged her to get her Covid booster Zostovax UTD She will call her insurance about coverage of Shingrix- will make nurse visit to get vaccine if covered. Pap smear UTD, will request copy from GYN Mammogram UTD Bone density UTD, will request copy from GYN Colon screening UTD Encouraged her to consume a balanced diet and exercise regimen Advised her to see an eye doctor and dentist annually. Will check CBC, CMET, Lipid, HIV and Hep C today  RTC in  1 year, sooner if needed Nicki Reaper, NP This visit occurred during the SARS-CoV-2 public health emergency.  Safety protocols were in place, including screening questions prior to the visit, additional usage of staff PPE, and extensive cleaning of exam room while observing appropriate contact time as indicated for disinfecting solutions.

## 2020-11-03 NOTE — Assessment & Plan Note (Signed)
She is self weaning Estradiol Advised her to continue to wean but not abruptly stop this medication due to possibility of rebound vasomotor symptoms Will monitor

## 2020-11-03 NOTE — Assessment & Plan Note (Signed)
Encouraged daily stretching Continue Ibuprofen as needed

## 2020-11-04 LAB — HIV ANTIBODY (ROUTINE TESTING W REFLEX): HIV 1&2 Ab, 4th Generation: NONREACTIVE

## 2020-11-04 LAB — HEPATITIS C ANTIBODY
Hepatitis C Ab: NONREACTIVE
SIGNAL TO CUT-OFF: 0 (ref <1.00)

## 2021-06-11 ENCOUNTER — Other Ambulatory Visit: Payer: Self-pay | Admitting: Internal Medicine

## 2021-06-13 NOTE — Telephone Encounter (Signed)
Refill request Estradiol Last refill 11/03/20 #30/5 Last office visit 11/03/20 No upcoming appointment scheduled

## 2021-06-19 ENCOUNTER — Telehealth: Payer: BC Managed Care – PPO | Admitting: Emergency Medicine

## 2021-06-19 ENCOUNTER — Other Ambulatory Visit: Payer: Self-pay | Admitting: Internal Medicine

## 2021-06-19 DIAGNOSIS — J069 Acute upper respiratory infection, unspecified: Secondary | ICD-10-CM | POA: Diagnosis not present

## 2021-06-19 NOTE — Patient Instructions (Addendum)
  [name], thank you for joining Cathlyn Parsons, NP for today's virtual visit.  While this provider is not your primary care provider (PCP), if your PCP is located in our provider database this encounter information will be shared with them immediately following your visit.  Consent: (Patient) Erica Robinson provided verbal consent for this virtual visit at the beginning of the encounter.  Current Medications:  Current Outpatient Medications:    Cholecalciferol (VITAMIN D3) 50 MCG (2000 UT) CHEW, Chew by mouth., Disp: , Rfl:    estradiol (ESTRACE) 1 MG tablet, TAKE 1 TABLET(1 MG) BY MOUTH DAILY, Disp: 30 tablet, Rfl: 0   Multiple Vitamin (MULTIVITAMIN) tablet, Take 1 tablet by mouth daily., Disp: , Rfl:    Multiple Vitamins-Minerals (HAIR SKIN AND NAILS FORMULA PO), Take by mouth., Disp: , Rfl:    Medications ordered in this encounter:  No orders of the defined types were placed in this encounter.    *If you need refills on other medications prior to your next appointment, please contact your pharmacy*  Follow-Up: Call back or seek an in-person evaluation if the symptoms worsen or if the condition fails to improve as anticipated.  Other Instructions Continue using over-the-counter cold and flu medicines to manage her symptoms.  Continue using your Flonase.  I recommend using a saline nasal spray or Neti pot nasal saline irrigation to try to help drain some of your congestion.  I would expect to be sick for another week or so.  If some of your symptoms change and you are feeling worse please seek help again either through a video virtual visit like this or through an E-visit or in person such as at an urgent care.  If you have been instructed to have an in-person evaluation today at a local Urgent Care facility, please use the link below. It will take you to a list of all of our available [facility], including address, phone number and hours of operation. Please do  not delay care.  [facility]  If you or a family member do not have a primary care provider, use the link below to schedule a visit and establish care. When you choose a [facility] primary care physician or advanced practice provider, you gain a long-term partner in health. Find a Primary Care Provider  Learn more about [facility]'s in-office and virtual care options: [facility] - Get Care Now

## 2021-06-19 NOTE — Progress Notes (Signed)
Virtual Visit Consent   Erica Robinson, you are scheduled for a virtual visit with a [facility] provider today.     Just as with appointments in the office, your consent must be obtained to participate.  Your consent will be active for this visit and any virtual visit you may have with one of our providers in the next 365 days.     If you have a MyChart account, a copy of this consent can be sent to you electronically.  All virtual visits are billed to your insurance company just like a traditional visit in the office.    As this is a virtual visit, video technology does not allow for your provider to perform a traditional examination.  This may limit your provider's ability to fully assess your condition.  If your provider identifies any concerns that need to be evaluated in person or the need to arrange testing (such as labs, EKG, etc.), we will make arrangements to do so.     Although advances in technology are sophisticated, we cannot ensure that it will always work on either your end or our end.  If the connection with a video visit is poor, the visit may have to be switched to a telephone visit.  With either a video or telephone visit, we are not always able to ensure that we have a secure connection.     I need to obtain your verbal consent now.   Are you willing to proceed with your visit today?    Erica Robinson has provided verbal consent on 06/19/2021 for a virtual visit (video or telephone).   Cathlyn Parsons, NP   Date: 06/19/2021 10:29 AM   Virtual Visit via Video Note   I, Cathlyn Parsons, connected with  Erica Robinson  ([id number], Jan 09, 1961) on 06/19/21 at 10:15 AM EST by a video-enabled telemedicine application and verified that I am speaking with the correct person using two identifiers.  Location: Patient: Virtual Visit Location Patient: Home Provider: Virtual Visit Location Provider: Home Office   I discussed the limitations of evaluation and management by  telemedicine and the availability of in person appointments. The patient expressed understanding and agreed to proceed.    History of Present Illness: Erica Robinson is a 60 y.o. who identifies as a female who was assigned female at birth, and is being seen today for cold symptoms.  She started feeling ill on 06/16/2021 and felt like it was just a usual cold.  The symptoms have been getting progressively worse with progressively worsening nasal congestion is the biggest problem.  She has a headache, sinus pressure, significant nasal congestion and rhinorrhea, and the low-grade fever this morning at 99 5 F.  She does not have a sore throat.  She does have a cough but she feels it is due from postnasal drip as she does not feel short of breath or have wheezing and does not feel it is chest congestion.  She has been using over-the-counter medications such as Mucinex, DayQuil, NyQuil and her Flonase nasal spray.  She has not taken a COVID test to see if it might be COVID.  She does not have body aches.  HPI: HPI  Problems:  Patient Active Problem List   Diagnosis Date Noted   Low back pain 01/05/2020   Postmenopausal symptoms 01/05/2020    Allergies:  Allergies  Allergen Reactions   Celebrex [Celecoxib] Other (See Comments)    Swelling of joints   Morphine  And Related Other (See Comments)    Severe migraines   Medications:  Current Outpatient Medications:    Cholecalciferol (VITAMIN D3) 50 MCG (2000 UT) CHEW, Chew by mouth., Disp: , Rfl:    estradiol (ESTRACE) 1 MG tablet, TAKE 1 TABLET(1 MG) BY MOUTH DAILY, Disp: 30 tablet, Rfl: 0   Multiple Vitamin (MULTIVITAMIN) tablet, Take 1 tablet by mouth daily., Disp: , Rfl:    Multiple Vitamins-Minerals (HAIR SKIN AND NAILS FORMULA PO), Take by mouth., Disp: , Rfl:   Observations/Objective: Patient is well-developed, well-nourished in no acute distress.  Resting comfortably  at home.  Head is normocephalic, atraumatic.  No labored breathing.   Speech is clear and coherent with logical content.  Patient is alert and oriented at baseline.    Assessment and Plan: 1. Upper respiratory tract infection, unspecified type  Other than adding a Neti pot or saline nasal irrigation to her treatment regimen, I reassured patient that she is doing all of the right things to manage her cold symptoms.  Reviewed reasons for seeking follow-up care.  Given note to work remote at work this week.  Follow Up Instructions: I discussed the assessment and treatment plan with the patient. The patient was provided an opportunity to ask questions and all were answered. The patient agreed with the plan and demonstrated an understanding of the instructions.  A copy of instructions were sent to the patient via MyChart unless otherwise noted below.  The patient was advised to call back or seek an in-person evaluation if the symptoms worsen or if the condition fails to improve as anticipated.  Time:  I spent 9 minutes with the patient via telehealth technology discussing the above problems/concerns.    Cathlyn Parsons, NP

## 2021-08-14 ENCOUNTER — Other Ambulatory Visit: Payer: Self-pay | Admitting: Internal Medicine

## 2021-08-14 ENCOUNTER — Encounter: Payer: Self-pay | Admitting: Internal Medicine

## 2021-08-15 MED ORDER — ESTRADIOL 1 MG PO TABS: 1.0000 mg | ORAL_TABLET | Freq: Every day | ORAL | 0 refills | Status: DC

## 2021-10-02 ENCOUNTER — Other Ambulatory Visit: Payer: Self-pay | Admitting: Internal Medicine

## 2021-10-02 DIAGNOSIS — Z1231 Encounter for screening mammogram for malignant neoplasm of breast: Secondary | ICD-10-CM

## 2021-10-09 ENCOUNTER — Telehealth: Payer: BC Managed Care – PPO | Admitting: Nurse Practitioner

## 2021-10-09 DIAGNOSIS — N3000 Acute cystitis without hematuria: Secondary | ICD-10-CM

## 2021-10-09 MED ORDER — NITROFURANTOIN MONOHYD MACRO 100 MG PO CAPS: 100.0000 mg | ORAL_CAPSULE | Freq: Two times a day (BID) | ORAL | 0 refills | Status: AC

## 2021-10-09 NOTE — Progress Notes (Signed)
Virtual Visit Consent   Erica Robinson, you are scheduled for a virtual visit with a [facility] provider today.     Just as with appointments in the office, your consent must be obtained to participate.  Your consent will be active for this visit and any virtual visit you may have with one of our providers in the next 365 days.     If you have a MyChart account, a copy of this consent can be sent to you electronically.  All virtual visits are billed to your insurance company just like a traditional visit in the office.    As this is a virtual visit, video technology does not allow for your provider to perform a traditional examination.  This may limit your provider's ability to fully assess your condition.  If your provider identifies any concerns that need to be evaluated in person or the need to arrange testing (such as labs, EKG, etc.), we will make arrangements to do so.     Although advances in technology are sophisticated, we cannot ensure that it will always work on either your end or our end.  If the connection with a video visit is poor, the visit may have to be switched to a telephone visit.  With either a video or telephone visit, we are not always able to ensure that we have a secure connection.     I need to obtain your verbal consent now.   Are you willing to proceed with your visit today?    Erica Robinson has provided verbal consent on 10/09/2021 for a virtual visit (video or telephone).   Viviano Simas, FNP   Date: 10/09/2021 8:19 AM   Virtual Visit via Video Note   I, Viviano Simas, connected with  Erica Robinson  ([id number], 1960/12/06) on 10/09/21 at  8:15 AM EST by a video-enabled telemedicine application and verified that I am speaking with the correct person using two identifiers.  Location: Patient: Virtual Visit Location Patient: Home Provider: Virtual Visit Location Provider: Home Office   I discussed the limitations of evaluation and management by  telemedicine and the availability of in person appointments. The patient expressed understanding and agreed to proceed.    History of Present Illness: Erica Robinson is a 61 y.o. who identifies as a female who was assigned female at birth, and is being seen today for urinary frequency and urgency. She has been using AZO and using Cranberry juice to attempt relief.  She started to have some right back pain and that started to worry her. She does travel and spends a lot of time in her care that she believes irritated everything.   She feels she may have a low grade fever today Denies Nausea or vomiting.    She has not had a UTI in a long time.   Problems:  Patient Active Problem List   Diagnosis Date Noted   Low back pain 01/05/2020   Postmenopausal symptoms 01/05/2020    Allergies:  Allergies  Allergen Reactions   Celebrex [Celecoxib] Other (See Comments)    Swelling of joints   Morphine And Related Other (See Comments)    Severe migraines   Medications:  Current Outpatient Medications:    Cholecalciferol (VITAMIN D3) 50 MCG (2000 UT) CHEW, Chew by mouth., Disp: , Rfl:    estradiol (ESTRACE) 1 MG tablet, Take 1 tablet (1 mg total) by mouth daily., Disp: 90 tablet, Rfl: 0   Multiple Vitamin (MULTIVITAMIN) tablet, Take 1  tablet by mouth daily., Disp: , Rfl:    Multiple Vitamins-Minerals (HAIR SKIN AND NAILS FORMULA PO), Take by mouth., Disp: , Rfl:   Observations/Objective: Patient is well-developed, well-nourished in no acute distress.  Resting comfortably at home.  Head is normocephalic, atraumatic.  No labored breathing.  Speech is clear and coherent with logical content.  Patient is alert and oriented at baseline.    Assessment and Plan: 1. Acute cystitis without hematuria  - nitrofurantoin, macrocrystal-monohydrate, (MACROBID) 100 MG capsule; Take 1 capsule (100 mg total) by mouth 2 (two) times daily for 5 days.  Dispense: 10 capsule; Refill: 0     Follow Up  Instructions: I discussed the assessment and treatment plan with the patient. The patient was provided an opportunity to ask questions and all were answered. The patient agreed with the plan and demonstrated an understanding of the instructions.  A copy of instructions were sent to the patient via MyChart unless otherwise noted below.    The patient was advised to call back or seek an in-person evaluation if the symptoms worsen or if the condition fails to improve as anticipated.  Time:  I spent 10 minutes with the patient via telehealth technology discussing the above problems/concerns.    Apolonio Schneiders, FNP

## 2021-11-16 ENCOUNTER — Ambulatory Visit: Payer: BC Managed Care – PPO

## 2021-11-28 ENCOUNTER — Ambulatory Visit
Admission: RE | Admit: 2021-11-28 | Discharge: 2021-11-28 | Disposition: A | Discharge status: Home / Self Care | Payer: BC Managed Care – PPO | Source: Ambulatory Visit | Attending: Internal Medicine | Admitting: Internal Medicine

## 2021-11-28 DIAGNOSIS — Z1231 Encounter for screening mammogram for malignant neoplasm of breast: Secondary | ICD-10-CM | POA: Diagnosis not present

## 2022-02-15 ENCOUNTER — Encounter: Payer: Self-pay | Admitting: Internal Medicine

## 2022-02-15 ENCOUNTER — Ambulatory Visit (INDEPENDENT_AMBULATORY_CARE_PROVIDER_SITE_OTHER): Payer: BC Managed Care – PPO | Admitting: Internal Medicine

## 2022-02-15 VITALS — BP 122/78 | HR 76 | Temp 96.9°F | Ht 62.0 in | Wt 154.0 lb

## 2022-02-15 DIAGNOSIS — R7303 Prediabetes: Secondary | ICD-10-CM | POA: Diagnosis not present

## 2022-02-15 DIAGNOSIS — E7800 Pure hypercholesterolemia, unspecified: Secondary | ICD-10-CM | POA: Diagnosis not present

## 2022-02-15 DIAGNOSIS — Z780 Asymptomatic menopausal state: Secondary | ICD-10-CM

## 2022-02-15 DIAGNOSIS — Z23 Encounter for immunization: Secondary | ICD-10-CM

## 2022-02-15 DIAGNOSIS — Z6829 Body mass index (BMI) 29.0-29.9, adult: Secondary | ICD-10-CM | POA: Insufficient documentation

## 2022-02-15 DIAGNOSIS — Z6828 Body mass index (BMI) 28.0-28.9, adult: Secondary | ICD-10-CM | POA: Insufficient documentation

## 2022-02-15 DIAGNOSIS — D709 Neutropenia, unspecified: Secondary | ICD-10-CM | POA: Insufficient documentation

## 2022-02-15 DIAGNOSIS — E663 Overweight: Secondary | ICD-10-CM

## 2022-02-15 DIAGNOSIS — Z0001 Encounter for general adult medical examination with abnormal findings: Secondary | ICD-10-CM

## 2022-02-15 MED ORDER — ESTRADIOL 1 MG PO TABS: 1.0000 mg | ORAL_TABLET | Freq: Every day | ORAL | 1 refills | Status: DC

## 2022-02-15 NOTE — Progress Notes (Signed)
Subjective:    Patient ID: Erica Robinson, female    DOB: 10/30/1960, 61 y.o.   MRN: [id number]  HPI  Patient presents to clinic today for her annual exam.  Flu: 04/2021 Tetanus: unsure COVID: Pfizer x2 Zostavax: 02/2013 Shingrix: Never Pap smear: Hysterectomy Mammogram: 11/2021  Bone density: Never Colon screening: 12/2012 Vision screening: as needed Dentist: biannually  Diet: She does eat some meat. She consumes fruits and veggies. She tries to avoid fried foods. She drinks mostly water.  Exercise: None  Review of Systems     Past Medical History:  Diagnosis Date   Bulging disc    L5    Current Outpatient Medications  Medication Sig Dispense Refill   Cholecalciferol (VITAMIN D3) 50 MCG (2000 UT) CHEW Chew by mouth.     estradiol (ESTRACE) 1 MG tablet Take 1 tablet (1 mg total) by mouth daily. 90 tablet 0   Multiple Vitamin (MULTIVITAMIN) tablet Take 1 tablet by mouth daily.     Multiple Vitamins-Minerals (HAIR SKIN AND NAILS FORMULA PO) Take by mouth.     No current facility-administered medications for this visit.    Allergies  Allergen Reactions   Celebrex [Celecoxib] Other (See Comments)    Swelling of joints   Morphine And Related Other (See Comments)    Severe migraines    Family History  Problem Relation Age of Onset   Hyperlipidemia Mother    Heart disease Mother    Hypertension Mother    Kidney disease Mother    Diabetes Mother    Hyperlipidemia Father    Colon polyps Father    Arthritis Father    Breast cancer Sister    Parkinson's disease Maternal Grandmother    Hyperlipidemia Sister    Hyperlipidemia Sister    Diabetes Sister    Colon cancer Neg Hx    Rectal cancer Neg Hx    Stomach cancer Neg Hx     Social History   Socioeconomic History   Marital status: Married    Spouse name: Not on file   Number of children: 2   Years of education: Not on file   Highest education level: Not on file  Occupational History    Employer:  [employer]  Tobacco Use   Smoking status: Never   Smokeless tobacco: Never  Substance and Sexual Activity   Alcohol use: No   Drug use: No   Sexual activity: Not on file  Other Topics Concern   Not on file  Social History Narrative   Not on file   Social Determinants of Health   Financial Resource Strain: Not on file  Food Insecurity: Not on file  Transportation Needs: Not on file  Physical Activity: Not on file  Stress: Not on file  Social Connections: Not on file  Intimate Partner Violence: Not on file     Constitutional: Patient reports fatigue.  Denies fever, malaise, headache or abrupt weight changes.  HEENT: Denies eye pain, eye redness, ear pain, ringing in the ears, wax buildup, runny nose, nasal congestion, bloody nose, or sore throat. Respiratory: Denies difficulty breathing, shortness of breath, cough or sputum production.   Cardiovascular: Denies chest pain, chest tightness, palpitations or swelling in the hands or feet.  Gastrointestinal: Denies abdominal pain, bloating, constipation, diarrhea or blood in the stool.  GU: Denies urgency, frequency, pain with urination, burning sensation, blood in urine, odor or discharge. Musculoskeletal: Denies decrease in range of motion, difficulty with gait, muscle pain or joint pain  and swelling.  Skin: Denies redness, rashes, lesions or ulcercations.  Neurological: Patient reports hot flashes.  Denies dizziness, difficulty with memory, difficulty with speech or problems with balance and coordination.  Psych: Denies anxiety, depression, SI/HI.  No other specific complaints in a complete review of systems (except as listed in HPI above).  Objective:   Physical Exam  BP 122/78 (BP Location: Left Arm, Patient Position: Sitting, Cuff Size: Normal)   Pulse 76   Temp (!) 96.9 F (36.1 C) (Temporal)   Ht '5\' 2"'  (1.575 m)   Wt 154 lb (69.9 kg)   SpO2 100%   BMI 28.17 kg/m   Wt Readings from Last 3 Encounters:   11/03/20 151 lb (68.5 kg)  01/05/20 152 lb (68.9 kg)  12/04/13 136 lb (61.7 kg)    General: Appears her stated age, overweight, in NAD. Skin: Warm, dry and intact.  HEENT: Head: normal shape and size; Eyes: sclera white, no icterus, conjunctiva pink, PERRLA and EOMs intact;  Neck:  Neck supple, trachea midline. No masses, lumps or thyromegaly present.  Cardiovascular: Normal rate and rhythm. S1,S2 noted.  No murmur, rubs or gallops noted. No JVD or BLE edema. No carotid bruits noted. Pulmonary/Chest: Normal effort and positive vesicular breath sounds. No respiratory distress. No wheezes, rales or ronchi noted.  Abdomen: Soft and nontender. Normal bowel sounds.  Musculoskeletal: Strength 5/5 BUE/BLE.  No difficulty with gait.  Neurological: Alert and oriented. Cranial nerves II-XII grossly intact. Coordination normal.  Psychiatric: Mood and affect normal. Behavior is normal. Judgment and thought content normal.    BMET    Component Value Date/Time   NA 143 11/03/2020 0850   K 4.0 11/03/2020 0850   CL 105 11/03/2020 0850   CO2 31 11/03/2020 0850   GLUCOSE 100 (H) 11/03/2020 0850   BUN 21 11/03/2020 0850   CREATININE 0.79 11/03/2020 0850   CALCIUM 9.2 11/03/2020 0850    Lipid Panel     Component Value Date/Time   CHOL 234 (H) 11/03/2020 0850   TRIG 56.0 11/03/2020 0850   HDL 87.10 11/03/2020 0850   CHOLHDL 3 11/03/2020 0850   VLDL 11.2 11/03/2020 0850   LDLCALC 136 (H) 11/03/2020 0850    CBC    Component Value Date/Time   WBC 3.6 (L) 11/03/2020 0850   RBC 4.40 11/03/2020 0850   HGB 13.2 11/03/2020 0850   HCT 39.2 11/03/2020 0850   PLT 225.0 11/03/2020 0850   MCV 88.9 11/03/2020 0850   MCHC 33.8 11/03/2020 0850   RDW 13.0 11/03/2020 0850    Hgb A1C Lab Results  Component Value Date   HGBA1C 5.7 11/03/2020           Assessment & Plan:   Preventative Health Maintenance:  Encouraged her to get a flu shot in the fall Tetanus today Encouraged her to get  her COVID booster Zostavax UTD Discussed Shingrix vaccine, she will check coverage with her insurance company and schedule a nurse visit if she would like to have this done She no longer needs Pap smears Mammogram UTD Bone density ordered-she will have this done with her next mammogram Colon screening UTD Encouraged her to consume a balanced diet and exercise regimen Advised her to see an eye doctor and dentist annually We will check CBC, c-Met, lipid, A1c today  RTC in 6 months, follow-up chronic conditions Webb Silversmith, NP

## 2022-02-15 NOTE — Assessment & Plan Note (Signed)
Encourage diet and exercise for weight loss 

## 2022-02-15 NOTE — Patient Instructions (Signed)
Health Maintenance for Postmenopausal Women Menopause is a normal process in which your ability to get pregnant comes to an end. This process happens slowly over many months or years, usually between the ages of 48 and 55. Menopause is complete when you have missed your menstrual period for 12 months. It is important to talk with your health care provider about some of the most common conditions that affect women after menopause (postmenopausal women). These include heart disease, cancer, and bone loss (osteoporosis). Adopting a healthy lifestyle and getting preventive care can help to promote your health and wellness. The actions you take can also lower your chances of developing some of these common conditions. What are the signs and symptoms of menopause? During menopause, you may have the following symptoms: Hot flashes. These can be moderate or severe. Night sweats. Decrease in sex drive. Mood swings. Headaches. Tiredness (fatigue). Irritability. Memory problems. Problems falling asleep or staying asleep. Talk with your health care provider about treatment options for your symptoms. Do I need hormone replacement therapy? Hormone replacement therapy is effective in treating symptoms that are caused by menopause, such as hot flashes and night sweats. Hormone replacement carries certain risks, especially as you become older. If you are thinking about using estrogen or estrogen with progestin, discuss the benefits and risks with your health care provider. How can I reduce my risk for heart disease and stroke? The risk of heart disease, heart attack, and stroke increases as you age. One of the causes may be a change in the body's hormones during menopause. This can affect how your body uses dietary fats, triglycerides, and cholesterol. Heart attack and stroke are medical emergencies. There are many things that you can do to help prevent heart disease and stroke. Watch your blood pressure High  blood pressure causes heart disease and increases the risk of stroke. This is more likely to develop in people who have high blood pressure readings or are overweight. Have your blood pressure checked: Every 3-5 years if you are 18-39 years of age. Every year if you are 40 years old or older. Eat a healthy diet  Eat a diet that includes plenty of vegetables, fruits, low-fat dairy products, and lean protein. Do not eat a lot of foods that are high in solid fats, added sugars, or sodium. Get regular exercise Get regular exercise. This is one of the most important things you can do for your health. Most adults should: Try to exercise for at least 150 minutes each week. The exercise should increase your heart rate and make you sweat (moderate-intensity exercise). Try to do strengthening exercises at least twice each week. Do these in addition to the moderate-intensity exercise. Spend less time sitting. Even light physical activity can be beneficial. Other tips Work with your health care provider to achieve or maintain a healthy weight. Do not use any products that contain nicotine or tobacco. These products include cigarettes, chewing tobacco, and vaping devices, such as e-cigarettes. If you need help quitting, ask your health care provider. Know your numbers. Ask your health care provider to check your cholesterol and your blood sugar (glucose). Continue to have your blood tested as directed by your health care provider. Do I need screening for cancer? Depending on your health history and family history, you may need to have cancer screenings at different stages of your life. This may include screening for: Breast cancer. Cervical cancer. Lung cancer. Colorectal cancer. What is my risk for osteoporosis? After menopause, you may be   at increased risk for osteoporosis. Osteoporosis is a condition in which bone destruction happens more quickly than new bone creation. To help prevent osteoporosis or  the bone fractures that can happen because of osteoporosis, you may take the following actions: If you are 19-50 years old, get at least 1,000 mg of calcium and at least 600 international units (IU) of vitamin D per day. If you are older than age 50 but younger than age 70, get at least 1,200 mg of calcium and at least 600 international units (IU) of vitamin D per day. If you are older than age 70, get at least 1,200 mg of calcium and at least 800 international units (IU) of vitamin D per day. Smoking and drinking excessive alcohol increase the risk of osteoporosis. Eat foods that are rich in calcium and vitamin D, and do weight-bearing exercises several times each week as directed by your health care provider. How does menopause affect my mental health? Depression may occur at any age, but it is more common as you become older. Common symptoms of depression include: Feeling depressed. Changes in sleep patterns. Changes in appetite or eating patterns. Feeling an overall lack of motivation or enjoyment of activities that you previously enjoyed. Frequent crying spells. Talk with your health care provider if you think that you are experiencing any of these symptoms. General instructions See your health care provider for regular wellness exams and vaccines. This may include: Scheduling regular health, dental, and eye exams. Getting and maintaining your vaccines. These include: Influenza vaccine. Get this vaccine each year before the flu season begins. Pneumonia vaccine. Shingles vaccine. Tetanus, diphtheria, and pertussis (Tdap) booster vaccine. Your health care provider may also recommend other immunizations. Tell your health care provider if you have ever been abused or do not feel safe at home. Summary Menopause is a normal process in which your ability to get pregnant comes to an end. This condition causes hot flashes, night sweats, decreased interest in sex, mood swings, headaches, or lack  of sleep. Treatment for this condition may include hormone replacement therapy. Take actions to keep yourself healthy, including exercising regularly, eating a healthy diet, watching your weight, and checking your blood pressure and blood sugar levels. Get screened for cancer and depression. Make sure that you are up to date with all your vaccines. This information is not intended to replace advice given to you by your health care provider. Make sure you discuss any questions you have with your health care provider. Document Revised: 12/19/2020 Document Reviewed: 12/19/2020 Elsevier Patient Education  2023 Elsevier Inc.  

## 2022-02-16 LAB — COMPLETE METABOLIC PANEL WITH GFR
AG Ratio: 2 (calc) (ref 1.0–2.5)
ALT: 19 U/L (ref 6–29)
AST: 22 U/L (ref 10–35)
Albumin: 4.5 g/dL (ref 3.6–5.1)
Alkaline phosphatase (APISO): 67 U/L (ref 37–153)
BUN: 15 mg/dL (ref 7–25)
CO2: 28 mmol/L (ref 20–32)
Calcium: 9.4 mg/dL (ref 8.6–10.4)
Chloride: 104 mmol/L (ref 98–110)
Creat: 0.85 mg/dL (ref 0.50–1.05)
Globulin: 2.3 g/dL (calc) (ref 1.9–3.7)
Glucose, Bld: 81 mg/dL (ref 65–99)
Potassium: 4.3 mmol/L (ref 3.5–5.3)
Sodium: 140 mmol/L (ref 135–146)
Total Bilirubin: 0.4 mg/dL (ref 0.2–1.2)
Total Protein: 6.8 g/dL (ref 6.1–8.1)
eGFR: 78 mL/min/{1.73_m2} (ref >=60)

## 2022-02-16 LAB — CBC
HCT: 41.5 % (ref 35.0–45.0)
Hemoglobin: 13.8 g/dL (ref 11.7–15.5)
MCH: 30.7 pg (ref 27.0–33.0)
MCHC: 33.3 g/dL (ref 32.0–36.0)
MCV: 92.4 fL (ref 80.0–100.0)
MPV: 10.9 fL (ref 7.5–12.5)
Platelets: 234 10*3/uL (ref 140–400)
RBC: 4.49 10*6/uL (ref 3.80–5.10)
RDW: 12.5 % (ref 11.0–15.0)
WBC: 3.8 10*3/uL (ref 3.8–10.8)

## 2022-02-16 LAB — HEMOGLOBIN A1C
Hgb A1c MFr Bld: 5.4 % of total Hgb (ref <5.7)
Mean Plasma Glucose: 108 mg/dL
eAG (mmol/L): 6 mmol/L

## 2022-02-16 LAB — LIPID PANEL
Cholesterol: 267 mg/dL — ABNORMAL HIGH (ref <200)
HDL: 87 mg/dL (ref >=50)
LDL Cholesterol (Calc): 151 mg/dL (calc) — ABNORMAL HIGH
Non-HDL Cholesterol (Calc): 180 mg/dL (calc) — ABNORMAL HIGH (ref <130)
Total CHOL/HDL Ratio: 3.1 (calc) (ref <5.0)
Triglycerides: 157 mg/dL — ABNORMAL HIGH (ref <150)

## 2022-05-28 DIAGNOSIS — M1712 Unilateral primary osteoarthritis, left knee: Secondary | ICD-10-CM | POA: Diagnosis not present

## 2022-06-27 ENCOUNTER — Encounter: Payer: Self-pay | Admitting: Internal Medicine

## 2022-06-27 ENCOUNTER — Ambulatory Visit: Payer: BC Managed Care – PPO | Admitting: Internal Medicine

## 2022-06-27 VITALS — BP 108/64 | HR 61 | Temp 96.9°F | Wt 138.0 lb

## 2022-06-27 DIAGNOSIS — E7800 Pure hypercholesterolemia, unspecified: Secondary | ICD-10-CM

## 2022-06-27 LAB — LIPID PANEL
Cholesterol: 211 mg/dL — ABNORMAL HIGH (ref <200)
HDL: 79 mg/dL (ref >=50)
LDL Cholesterol (Calc): 114 mg/dL (calc) — ABNORMAL HIGH
Non-HDL Cholesterol (Calc): 132 mg/dL (calc) — ABNORMAL HIGH (ref <130)
Total CHOL/HDL Ratio: 2.7 (calc) (ref <5.0)
Triglycerides: 83 mg/dL (ref <150)

## 2022-06-27 NOTE — Assessment & Plan Note (Signed)
Lipid profile today Congratulated her on 20 pound weight loss Encourage low-fat diet

## 2022-06-27 NOTE — Patient Instructions (Signed)

## 2022-06-27 NOTE — Progress Notes (Signed)
Subjective:    Patient ID: Erica Robinson, female    DOB: [birth year], 61 y.o.   MRN: [id number]  HPI  Patient presents to clinic today for follow-up of HLD.  Her last LDL was 151, triglycerides 157, 02/2022.  She did not start any cholesterol-lowering medication.  She reports she has lost 20 pounds using [employer] and tries to consume a low-fat diet.  Review of Systems     Past Medical History:  Diagnosis Date   Bulging disc    L5    Current Outpatient Medications  Medication Sig Dispense Refill   Cholecalciferol (VITAMIN D3) 50 MCG (2000 UT) CHEW Chew by mouth.     estradiol (ESTRACE) 1 MG tablet Take 1 tablet (1 mg total) by mouth daily. 90 tablet 1   Multiple Vitamin (MULTIVITAMIN) tablet Take 1 tablet by mouth daily.     Multiple Vitamins-Minerals (HAIR SKIN AND NAILS FORMULA PO) Take by mouth.     No current facility-administered medications for this visit.    Allergies  Allergen Reactions   Celebrex [Celecoxib] Other (See Comments)    Swelling of joints   Morphine And Related Other (See Comments)    Severe migraines    Family History  Problem Relation Age of Onset   Hyperlipidemia Mother    Heart disease Mother    Hypertension Mother    Kidney disease Mother    Diabetes Mother    Hyperlipidemia Father    Colon polyps Father    Arthritis Father    Breast cancer Sister    Parkinson's disease Maternal Grandmother    Hyperlipidemia Sister    Hyperlipidemia Sister    Diabetes Sister    Colon cancer Neg Hx    Rectal cancer Neg Hx    Stomach cancer Neg Hx     Social History   Socioeconomic History   Marital status: Married    Spouse name: Not on file   Number of children: 2   Years of education: Not on file   Highest education level: Not on file  Occupational History    Employer: [employer]  Tobacco Use   Smoking status: Never   Smokeless tobacco: Never  Vaping Use   Vaping Use: Never used  Substance and Sexual Activity   Alcohol  use: No   Drug use: No   Sexual activity: Not on file  Other Topics Concern   Not on file  Social History Narrative   Not on file   Social Determinants of Health   Financial Resource Strain: Not on file  Food Insecurity: Not on file  Transportation Needs: Not on file  Physical Activity: Not on file  Stress: Not on file  Social Connections: Not on file  Intimate Partner Violence: Not on file     Constitutional: Denies fever, malaise, fatigue, headache or abrupt weight changes.  Respiratory: Denies difficulty breathing, shortness of breath, cough or sputum production.   Cardiovascular: Denies chest pain, chest tightness, palpitations or swelling in the hands or feet.  Neurological: Denies dizziness, difficulty with memory, difficulty with speech or problems with balance and coordination.    No other specific complaints in a complete review of systems (except as listed in HPI above).  Objective:   Physical Exam   BP 108/64 (BP Location: Left Arm, Patient Position: Sitting, Cuff Size: Normal)   Pulse 61   Temp (!) 96.9 F (36.1 C) (Temporal)   Wt 138 lb (62.6 kg)   SpO2 97%   BMI  25.24 kg/m   Wt Readings from Last 3 Encounters:  02/15/22 154 lb (69.9 kg)  11/03/20 151 lb (68.5 kg)  01/05/20 152 lb (68.9 kg)    General: Appears her stated age, well developed, well nourished in NAD. Cardiovascular: Normal rate and rhythm. S1,S2 noted.  No murmur, rubs or gallops noted. No carotid bruits noted. Pulmonary/Chest: Normal effort and positive vesicular breath sounds. No respiratory distress. No wheezes, rales or ronchi noted.  Neurological: Alert and oriented.   BMET    Component Value Date/Time   NA 140 02/15/2022 0901   K 4.3 02/15/2022 0901   CL 104 02/15/2022 0901   CO2 28 02/15/2022 0901   GLUCOSE 81 02/15/2022 0901   BUN 15 02/15/2022 0901   CREATININE 0.85 02/15/2022 0901   CALCIUM 9.4 02/15/2022 0901    Lipid Panel     Component Value Date/Time   CHOL  267 (H) 02/15/2022 0901   TRIG 157 (H) 02/15/2022 0901   HDL 87 02/15/2022 0901   CHOLHDL 3.1 02/15/2022 0901   VLDL 11.2 11/03/2020 0850   LDLCALC 151 (H) 02/15/2022 0901    CBC    Component Value Date/Time   WBC 3.8 02/15/2022 0901   RBC 4.49 02/15/2022 0901   HGB 13.8 02/15/2022 0901   HCT 41.5 02/15/2022 0901   PLT 234 02/15/2022 0901   MCV 92.4 02/15/2022 0901   MCH 30.7 02/15/2022 0901   MCHC 33.3 02/15/2022 0901   RDW 12.5 02/15/2022 0901    Hgb A1C Lab Results  Component Value Date   HGBA1C 5.4 02/15/2022           Assessment & Plan:     RTC in 2 months for follow-up of chronic conditions Nicki Reaper, NP

## 2022-08-19 ENCOUNTER — Other Ambulatory Visit: Payer: Self-pay | Admitting: Internal Medicine

## 2022-08-20 NOTE — Telephone Encounter (Signed)
Requested Prescriptions  Pending Prescriptions Disp Refills   estradiol (ESTRACE) 1 MG tablet [Pharmacy Med Name: ESTRADIOL 1 MG TABLET] 90 tablet 1    Sig: TAKE 1 TABLET BY MOUTH EVERY DAY     OB/GYN:  Estrogens Passed - 08/19/2022  1:01 AM      Passed - Mammogram is up-to-date per Health Maintenance      Passed - Last BP in normal range    BP Readings from Last 1 Encounters:  06/27/22 108/64         Passed - Valid encounter within last 12 months    Recent Outpatient Visits           1 month ago Pure hypercholesterolemia   [place], Coralie Keens, NP   6 months ago Encounter for general adult medical examination with abnormal findings   [facility] [place], Coralie Keens, NP

## 2022-12-17 ENCOUNTER — Other Ambulatory Visit: Payer: Self-pay | Admitting: Internal Medicine

## 2022-12-17 DIAGNOSIS — Z1231 Encounter for screening mammogram for malignant neoplasm of breast: Secondary | ICD-10-CM

## 2023-01-22 ENCOUNTER — Ambulatory Visit
Admission: RE | Admit: 2023-01-22 | Discharge: 2023-01-22 | Disposition: A | Discharge status: Home / Self Care | Payer: BC Managed Care – PPO | Source: Ambulatory Visit | Attending: Internal Medicine | Admitting: Internal Medicine

## 2023-01-22 ENCOUNTER — Encounter: Payer: Self-pay | Admitting: Internal Medicine

## 2023-01-22 DIAGNOSIS — Z1231 Encounter for screening mammogram for malignant neoplasm of breast: Secondary | ICD-10-CM | POA: Diagnosis not present

## 2023-01-22 DIAGNOSIS — M85851 Other specified disorders of bone density and structure, right thigh: Secondary | ICD-10-CM | POA: Diagnosis not present

## 2023-01-22 DIAGNOSIS — Z780 Asymptomatic menopausal state: Secondary | ICD-10-CM | POA: Insufficient documentation

## 2023-02-27 ENCOUNTER — Other Ambulatory Visit: Payer: Self-pay | Admitting: Internal Medicine

## 2023-02-27 NOTE — Telephone Encounter (Signed)
Requested Prescriptions  Pending Prescriptions Disp Refills   estradiol (ESTRACE) 1 MG tablet [Pharmacy Med Name: ESTRADIOL 1 MG TABLET] 90 tablet 1    Sig: TAKE 1 TABLET BY MOUTH EVERY DAY     OB/GYN:  Estrogens Passed - 02/27/2023  2:14 AM      Passed - Mammogram is up-to-date per Health Maintenance      Passed - Last BP in normal range    BP Readings from Last 1 Encounters:  06/27/22 108/64         Passed - Valid encounter within last 12 months    Recent Outpatient Visits           8 months ago Pure hypercholesterolemia   [place] [facility] [place], Salvadore Oxford, NP   1 year ago Encounter for general adult medical examination with abnormal findings   [place] [facility] [place], Salvadore Oxford, NP

## 2023-09-07 ENCOUNTER — Other Ambulatory Visit: Payer: Self-pay | Admitting: Internal Medicine

## 2023-09-09 NOTE — Telephone Encounter (Signed)
Pt needs a appt

## 2023-09-09 NOTE — Telephone Encounter (Signed)
Requested medication (s) are due for refill today: Yes  Requested medication (s) are on the active medication list: Yes  Last refill:  02/27/23  Future visit scheduled: No  Notes to clinic:  Left message to call and make appointment.    Requested Prescriptions  Pending Prescriptions Disp Refills   estradiol (ESTRACE) 1 MG tablet [Pharmacy Med Name: ESTRADIOL 1 MG TABLET] 90 tablet 1    Sig: TAKE 1 TABLET BY MOUTH EVERY DAY     OB/GYN:  Estrogens Failed - 09/09/2023  3:26 PM      Failed - Valid encounter within last 12 months    Recent Outpatient Visits           1 year ago Pure hypercholesterolemia   [place] [facility] [place], Salvadore Oxford, NP   1 year ago Encounter for general adult medical examination with abnormal findings   [place] [facility] [place], Salvadore Oxford, NP              Passed - Mammogram is up-to-date per Health Maintenance      Passed - Last BP in normal range    BP Readings from Last 1 Encounters:  06/27/22 108/64

## 2023-11-06 ENCOUNTER — Ambulatory Visit (INDEPENDENT_AMBULATORY_CARE_PROVIDER_SITE_OTHER): Admitting: Internal Medicine

## 2023-11-06 ENCOUNTER — Encounter: Payer: Self-pay | Admitting: Internal Medicine

## 2023-11-06 VITALS — BP 122/68 | Ht 62.0 in | Wt 159.4 lb

## 2023-11-06 DIAGNOSIS — Z1231 Encounter for screening mammogram for malignant neoplasm of breast: Secondary | ICD-10-CM | POA: Diagnosis not present

## 2023-11-06 DIAGNOSIS — E7800 Pure hypercholesterolemia, unspecified: Secondary | ICD-10-CM

## 2023-11-06 DIAGNOSIS — Z6829 Body mass index (BMI) 29.0-29.9, adult: Secondary | ICD-10-CM

## 2023-11-06 DIAGNOSIS — Z0001 Encounter for general adult medical examination with abnormal findings: Secondary | ICD-10-CM

## 2023-11-06 DIAGNOSIS — E663 Overweight: Secondary | ICD-10-CM

## 2023-11-06 DIAGNOSIS — R7303 Prediabetes: Secondary | ICD-10-CM

## 2023-11-06 DIAGNOSIS — Z1211 Encounter for screening for malignant neoplasm of colon: Secondary | ICD-10-CM

## 2023-11-06 NOTE — Patient Instructions (Signed)
 Health Maintenance for Postmenopausal Women Menopause is a normal process in which your ability to get pregnant comes to an end. This process happens slowly over many months or years, usually between the ages of 24 and 62. Menopause is complete when you have missed your menstrual period for 12 months. It is important to talk with your health care provider about some of the most common conditions that affect women after menopause (postmenopausal women). These include heart disease, cancer, and bone loss (osteoporosis). Adopting a healthy lifestyle and getting preventive care can help to promote your health and wellness. The actions you take can also lower your chances of developing some of these common conditions. What are the signs and symptoms of menopause? During menopause, you may have the following symptoms: Hot flashes. These can be moderate or severe. Night sweats. Decrease in sex drive. Mood swings. Headaches. Tiredness (fatigue). Irritability. Memory problems. Problems falling asleep or staying asleep. Talk with your health care provider about treatment options for your symptoms. Do I need hormone replacement therapy? Hormone replacement therapy is effective in treating symptoms that are caused by menopause, such as hot flashes and night sweats. Hormone replacement carries certain risks, especially as you become older. If you are thinking about using estrogen or estrogen with progestin, discuss the benefits and risks with your health care provider. How can I reduce my risk for heart disease and stroke? The risk of heart disease, heart attack, and stroke increases as you age. One of the causes may be a change in the body's hormones during menopause. This can affect how your body uses dietary fats, triglycerides, and cholesterol. Heart attack and stroke are medical emergencies. There are many things that you can do to help prevent heart disease and stroke. Watch your blood pressure High  blood pressure causes heart disease and increases the risk of stroke. This is more likely to develop in people who have high blood pressure readings or are overweight. Have your blood pressure checked: Every 3-5 years if you are 50-75 years of age. Every year if you are 77 years old or older. Eat a healthy diet  Eat a diet that includes plenty of vegetables, fruits, low-fat dairy products, and lean protein. Do not eat a lot of foods that are high in solid fats, added sugars, or sodium. Get regular exercise Get regular exercise. This is one of the most important things you can do for your health. Most adults should: Try to exercise for at least 150 minutes each week. The exercise should increase your heart rate and make you sweat (moderate-intensity exercise). Try to do strengthening exercises at least twice each week. Do these in addition to the moderate-intensity exercise. Spend less time sitting. Even light physical activity can be beneficial. Other tips Work with your health care provider to achieve or maintain a healthy weight. Do not use any products that contain nicotine or tobacco. These products include cigarettes, chewing tobacco, and vaping devices, such as e-cigarettes. If you need help quitting, ask your health care provider. Know your numbers. Ask your health care provider to check your cholesterol and your blood sugar (glucose). Continue to have your blood tested as directed by your health care provider. Do I need screening for cancer? Depending on your health history and family history, you may need to have cancer screenings at different stages of your life. This may include screening for: Breast cancer. Cervical cancer. Lung cancer. Colorectal cancer. What is my risk for osteoporosis? After menopause, you may be  at increased risk for osteoporosis. Osteoporosis is a condition in which bone destruction happens more quickly than new bone creation. To help prevent osteoporosis or  the bone fractures that can happen because of osteoporosis, you may take the following actions: If you are 61-3 years old, get at least 1,000 mg of calcium and at least 600 international units (IU) of vitamin D per day. If you are older than age 61 but younger than age 75, get at least 1,200 mg of calcium and at least 600 international units (IU) of vitamin D per day. If you are older than age 62, get at least 1,200 mg of calcium and at least 800 international units (IU) of vitamin D per day. Smoking and drinking excessive alcohol increase the risk of osteoporosis. Eat foods that are rich in calcium and vitamin D, and do weight-bearing exercises several times each week as directed by your health care provider. How does menopause affect my mental health? Depression may occur at any age, but it is more common as you become older. Common symptoms of depression include: Feeling depressed. Changes in sleep patterns. Changes in appetite or eating patterns. Feeling an overall lack of motivation or enjoyment of activities that you previously enjoyed. Frequent crying spells. Talk with your health care provider if you think that you are experiencing any of these symptoms. General instructions See your health care provider for regular wellness exams and vaccines. This may include: Scheduling regular health, dental, and eye exams. Getting and maintaining your vaccines. These include: Influenza vaccine. Get this vaccine each year before the flu season begins. Pneumonia vaccine. Shingles vaccine. Tetanus, diphtheria, and pertussis (Tdap) booster vaccine. Your health care provider may also recommend other immunizations. Tell your health care provider if you have ever been abused or do not feel safe at home. Summary Menopause is a normal process in which your ability to get pregnant comes to an end. This condition causes hot flashes, night sweats, decreased interest in sex, mood swings, headaches, or lack  of sleep. Treatment for this condition may include hormone replacement therapy. Take actions to keep yourself healthy, including exercising regularly, eating a healthy diet, watching your weight, and checking your blood pressure and blood sugar levels. Get screened for cancer and depression. Make sure that you are up to date with all your vaccines. This information is not intended to replace advice given to you by your health care provider. Make sure you discuss any questions you have with your health care provider. Document Revised: 12/19/2020 Document Reviewed: 12/19/2020 Elsevier Patient Education  2024 [employer].

## 2023-11-06 NOTE — Assessment & Plan Note (Signed)
 Encourage diet and exercise for weight loss

## 2023-11-06 NOTE — Progress Notes (Signed)
 Subjective:    Patient ID: Erica Robinson, female    DOB: 1961-02-26, 63 y.o.   MRN: [id number]  HPI  Patient presents to clinic today for her annual exam.  Flu: 05/2022 Tetanus: 02/2022 COVID: Pfizer x2 Zostavax: 02/2013 Shingrix: Never Pap smear: Hysterectomy Mammogram: 01/2023 Bone density: 01/2023 Colon screening: 12/2012 Vision screening: as needed Dentist: biannually  Diet: She does eat some meat. She consumes fruits and veggies. She tries to avoid fried foods. She drinks mostly water.  Exercise: None  Review of Systems     Past Medical History:  Diagnosis Date   Bulging disc    L5    Current Outpatient Medications  Medication Sig Dispense Refill   Cholecalciferol (VITAMIN D3) 50 MCG (2000 UT) CHEW Chew by mouth.     estradiol (ESTRACE) 1 MG tablet TAKE 1 TABLET BY MOUTH EVERY DAY 90 tablet 1   Multiple Vitamin (MULTIVITAMIN) tablet Take 1 tablet by mouth daily.     Multiple Vitamins-Minerals (HAIR SKIN AND NAILS FORMULA PO) Take by mouth.     No current facility-administered medications for this visit.    Allergies  Allergen Reactions   Celebrex [Celecoxib] Other (See Comments)    Swelling of joints   Morphine And Codeine Other (See Comments)    Severe migraines    Family History  Problem Relation Age of Onset   Hyperlipidemia Mother    Heart disease Mother    Hypertension Mother    Kidney disease Mother    Diabetes Mother    Hyperlipidemia Father    Colon polyps Father    Arthritis Father    Breast cancer Sister    Parkinson's disease Maternal Grandmother    Hyperlipidemia Sister    Hyperlipidemia Sister    Diabetes Sister    Colon cancer Neg Hx    Rectal cancer Neg Hx    Stomach cancer Neg Hx     Social History   Socioeconomic History   Marital status: Married    Spouse name: Not on file   Number of children: 2   Years of education: Not on file   Highest education level: Associate degree: academic program  Occupational History     Employer: [employer]  Tobacco Use   Smoking status: Never   Smokeless tobacco: Never  Vaping Use   Vaping status: Never Used  Substance and Sexual Activity   Alcohol use: No   Drug use: No   Sexual activity: Not on file  Other Topics Concern   Not on file  Social History Narrative   Not on file   Social Drivers of Health   Financial Resource Strain: Low Risk  (11/05/2023)   Overall Financial Resource Strain (CARDIA)    Difficulty of Paying Living Expenses: Not hard at all  Food Insecurity: No Food Insecurity (11/05/2023)   Hunger Vital Sign    Worried About Running Out of Food in the Last Year: Never true    Ran Out of Food in the Last Year: Never true  Transportation Needs: No Transportation Needs (11/05/2023)   PRAPARE - Administrator, Civil Service (Medical): No    Lack of Transportation (Non-Medical): No  Physical Activity: Insufficiently Active (11/05/2023)   Exercise Vital Sign    Days of Exercise per Week: 2 days    Minutes of Exercise per Session: 30 min  Stress: No Stress Concern Present (11/05/2023)   [employer] of Occupational Health - Occupational Stress Questionnaire    Feeling  of Stress : Not at all  Social Connections: Socially Integrated (11/05/2023)   Social Connection and Isolation Panel [NHANES]    Frequency of Communication with Friends and Family: More than three times a week    Frequency of Social Gatherings with Friends and Family: Three times a week    Attends Religious Services: More than 4 times per year    Active Member of Clubs or Organizations: Yes    Attends Engineer, structural: More than 4 times per year    Marital Status: Married  Catering manager Violence: Not on file     Constitutional:  Denies fever, fatigue, malaise, headache or abrupt weight changes.  HEENT: Denies eye pain, eye redness, ear pain, ringing in the ears, wax buildup, runny nose, nasal congestion, bloody nose, or sore  throat. Respiratory: Denies difficulty breathing, shortness of breath, cough or sputum production.   Cardiovascular: Denies chest pain, chest tightness, palpitations or swelling in the hands or feet.  Gastrointestinal: Denies abdominal pain, bloating, constipation, diarrhea or blood in the stool.  GU: Denies urgency, frequency, pain with urination, burning sensation, blood in urine, odor or discharge. Musculoskeletal: Denies decrease in range of motion, difficulty with gait, muscle pain or joint pain and swelling.  Skin: Denies redness, rashes, lesions or ulcercations.  Neurological: Patient reports hot flashes.  Denies dizziness, difficulty with memory, difficulty with speech or problems with balance and coordination.  Psych: Denies anxiety, depression, SI/HI.  No other specific complaints in a complete review of systems (except as listed in HPI above).  Objective:   Physical Exam  BP 122/68 (BP Location: Left Arm, Patient Position: Sitting, Cuff Size: Normal)   Ht 5\' 2"  (1.575 m)   Wt 159 lb 6.4 oz (72.3 kg)   BMI 29.15 kg/m    Wt Readings from Last 3 Encounters:  06/27/22 138 lb (62.6 kg)  02/15/22 154 lb (69.9 kg)  11/03/20 151 lb (68.5 kg)    General: Appears her stated age, overweight, in NAD. Skin: Warm, dry and intact.  HEENT: Head: normal shape and size; Eyes: sclera white, no icterus, conjunctiva pink, PERRLA and EOMs intact;  Neck:  Neck supple, trachea midline. No masses, lumps or thyromegaly present.  Cardiovascular: Normal rate and rhythm. S1,S2 noted.  No murmur, rubs or gallops noted. No JVD or BLE edema. No carotid bruits noted. Pulmonary/Chest: Normal effort and positive vesicular breath sounds. No respiratory distress. No wheezes, rales or ronchi noted.  Abdomen: Soft and nontender. Normal bowel sounds.  Musculoskeletal: Strength 5/5 BUE/BLE.  No difficulty with gait.  Neurological: Alert and oriented. Cranial nerves II-XII grossly intact. Coordination normal.   Psychiatric: Mood and affect normal. Behavior is normal. Judgment and thought content normal.    BMET    Component Value Date/Time   NA 140 02/15/2022 0901   K 4.3 02/15/2022 0901   CL 104 02/15/2022 0901   CO2 28 02/15/2022 0901   GLUCOSE 81 02/15/2022 0901   BUN 15 02/15/2022 0901   CREATININE 0.85 02/15/2022 0901   CALCIUM 9.4 02/15/2022 0901    Lipid Panel     Component Value Date/Time   CHOL 211 (H) 06/27/2022 0850   TRIG 83 06/27/2022 0850   HDL 79 06/27/2022 0850   CHOLHDL 2.7 06/27/2022 0850   VLDL 11.2 11/03/2020 0850   LDLCALC 114 (H) 06/27/2022 0850    CBC    Component Value Date/Time   WBC 3.8 02/15/2022 0901   RBC 4.49 02/15/2022 0901   HGB 13.8 02/15/2022  0901   HCT 41.5 02/15/2022 0901   PLT 234 02/15/2022 0901   MCV 92.4 02/15/2022 0901   MCH 30.7 02/15/2022 0901   MCHC 33.3 02/15/2022 0901   RDW 12.5 02/15/2022 0901    Hgb A1C Lab Results  Component Value Date   HGBA1C 5.4 02/15/2022           Assessment & Plan:   Preventative Health Maintenance:  Encouraged her to get a flu shot in the fall Tetanus UTD Encouraged her to get her COVID booster Zostavax UTD Discussed Shingrix vaccine, she will check coverage with her insurance company and schedule a nurse visit if she would like to have this done She no longer needs Pap smears Mammogram ordered-she will call to schedule Bone density UTD Cologuard ordered Encouraged her to consume a balanced diet and exercise regimen Advised her to see an eye doctor and dentist annually We will check CBC, c-Met, lipid, A1c today  RTC in 6 months, follow-up chronic conditions Nicki Reaper, NP

## 2023-11-07 ENCOUNTER — Encounter: Payer: Self-pay | Admitting: Internal Medicine

## 2023-11-07 LAB — COMPLETE METABOLIC PANEL WITHOUT GFR
AG Ratio: 1.9 (calc) (ref 1.0–2.5)
ALT: 18 U/L (ref 6–29)
AST: 23 U/L (ref 10–35)
Albumin: 4.4 g/dL (ref 3.6–5.1)
Alkaline phosphatase (APISO): 65 U/L (ref 37–153)
BUN: 16 mg/dL (ref 7–25)
CO2: 29 mmol/L (ref 20–32)
Calcium: 9.1 mg/dL (ref 8.6–10.4)
Chloride: 103 mmol/L (ref 98–110)
Creat: 0.7 mg/dL (ref 0.50–1.05)
Globulin: 2.3 g/dL (ref 1.9–3.7)
Glucose, Bld: 88 mg/dL (ref 65–99)
Potassium: 4.2 mmol/L (ref 3.5–5.3)
Sodium: 140 mmol/L (ref 135–146)
Total Bilirubin: 0.4 mg/dL (ref 0.2–1.2)
Total Protein: 6.7 g/dL (ref 6.1–8.1)

## 2023-11-07 LAB — LIPID PANEL
Cholesterol: 260 mg/dL — ABNORMAL HIGH (ref <200)
HDL: 90 mg/dL (ref >=50)
LDL Cholesterol (Calc): 145 mg/dL — ABNORMAL HIGH
Non-HDL Cholesterol (Calc): 170 mg/dL — ABNORMAL HIGH (ref <130)
Total CHOL/HDL Ratio: 2.9 (calc) (ref <5.0)
Triglycerides: 127 mg/dL (ref <150)

## 2023-11-07 LAB — CBC
HCT: 38.9 % (ref 35.0–45.0)
Hemoglobin: 13 g/dL (ref 11.7–15.5)
MCH: 30.1 pg (ref 27.0–33.0)
MCHC: 33.4 g/dL (ref 32.0–36.0)
MCV: 90 fL (ref 80.0–100.0)
MPV: 11.6 fL (ref 7.5–12.5)
Platelets: 246 10*3/uL (ref 140–400)
RBC: 4.32 10*6/uL (ref 3.80–5.10)
RDW: 12.4 % (ref 11.0–15.0)
WBC: 3.9 10*3/uL (ref 3.8–10.8)

## 2023-11-07 LAB — HEMOGLOBIN A1C
Hgb A1c MFr Bld: 5.7 %{Hb} — ABNORMAL HIGH (ref <5.7)
Mean Plasma Glucose: 117 mg/dL
eAG (mmol/L): 6.5 mmol/L

## 2023-11-27 LAB — COLOGUARD: COLOGUARD: NEGATIVE

## 2023-12-16 ENCOUNTER — Encounter: Payer: Self-pay | Admitting: Internal Medicine

## 2023-12-20 ENCOUNTER — Other Ambulatory Visit: Payer: Self-pay | Admitting: Internal Medicine

## 2023-12-23 ENCOUNTER — Encounter: Payer: Self-pay | Admitting: Internal Medicine

## 2023-12-23 NOTE — Telephone Encounter (Signed)
 Requested Prescriptions  Pending Prescriptions Disp Refills   estradiol  (ESTRACE ) 1 MG tablet [Pharmacy Med Name: ESTRADIOL  1 MG TABLET] 90 tablet 1    Sig: TAKE 1 TABLET BY MOUTH EVERY DAY     OB/GYN:  Estrogens Failed - 12/23/2023 12:25 PM      Failed - Valid encounter within last 12 months    Recent Outpatient Visits           1 month ago Encounter for general adult medical examination with abnormal findings   [facility] [place], Rankin Buzzard, NP              Passed - Mammogram is up-to-date per Health Maintenance      Passed - Last BP in normal range    BP Readings from Last 1 Encounters:  11/06/23 122/68

## 2024-02-05 IMAGING — MG MM DIGITAL SCREENING BILAT W/ TOMO AND CAD
8 series · 8 of 24 positions shown · non-contrast
Comparison: Previous exam(s).

CLINICAL DATA: Screening.

EXAM:
DIGITAL SCREENING BILATERAL MAMMOGRAM WITH TOMOSYNTHESIS AND CAD
TECHNIQUE: Bilateral screening digital craniocaudal and mediolateral oblique
mammograms were obtained. Bilateral screening digital breast
tomosynthesis was performed. The images were evaluated with
computer-aided detection.

[L CC synth-2D]
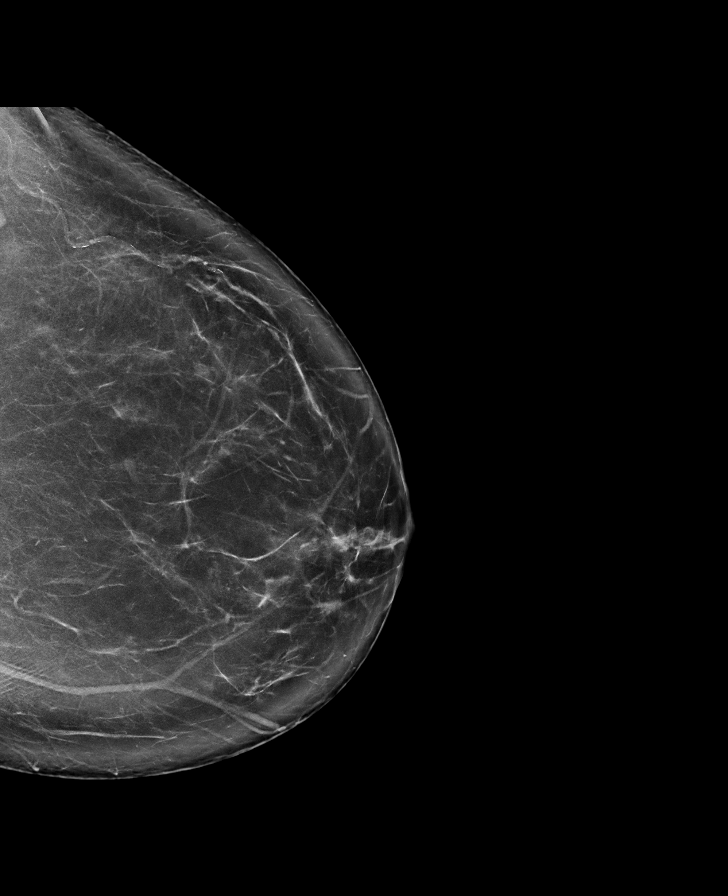

[R CC synth-2D]
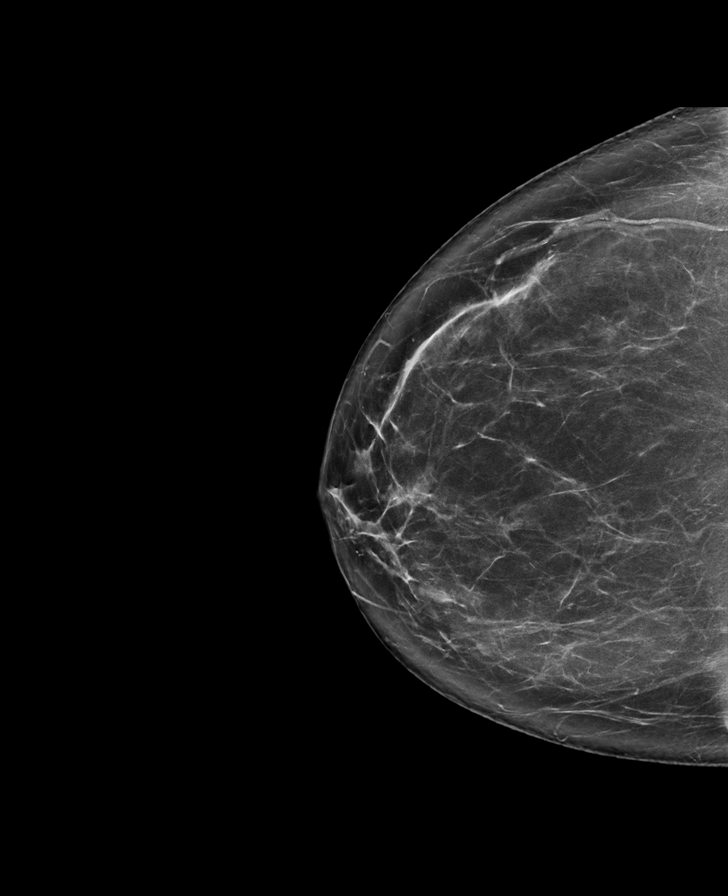

[R MLO synth-2D]
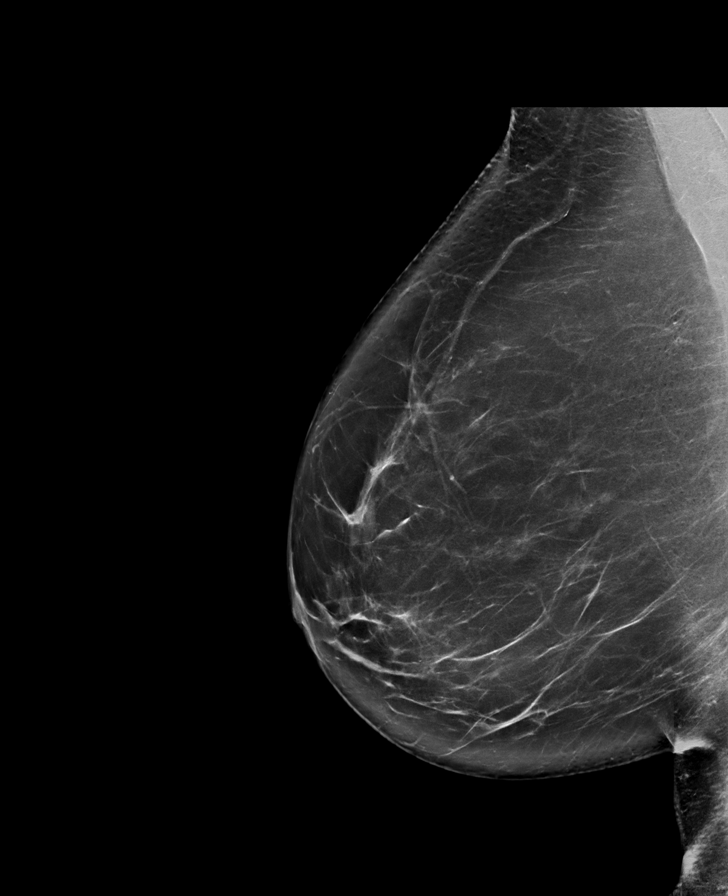

[L MLO synth-2D]
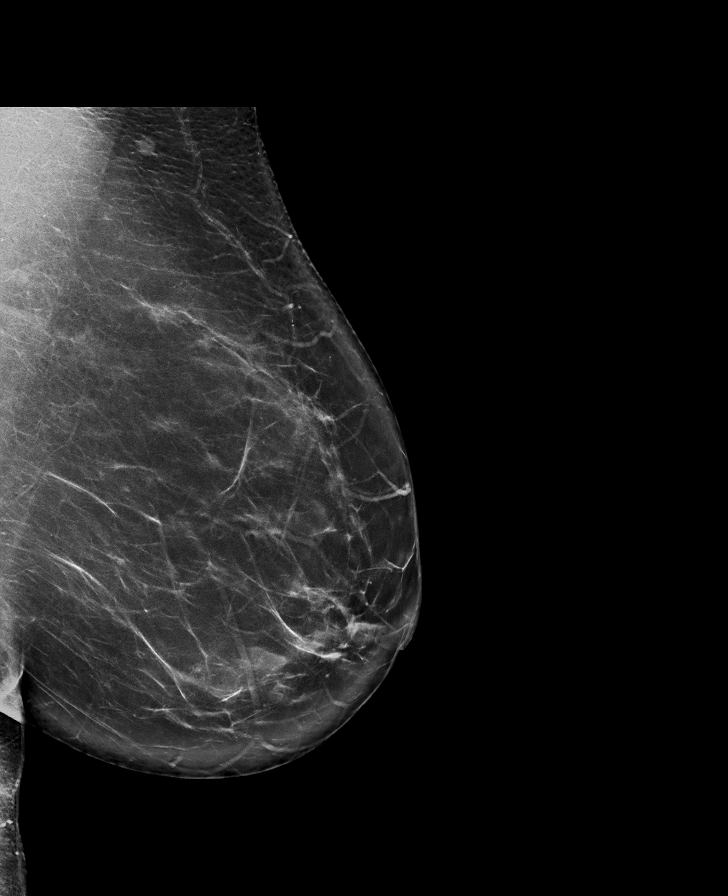

[R CC tomo · tomo slice 41/82.0]
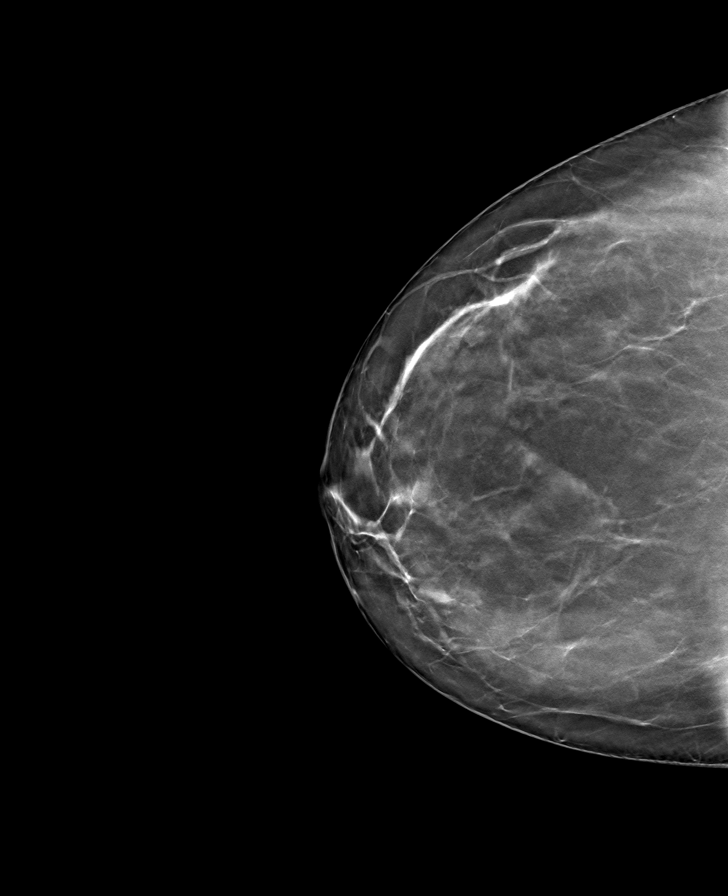

[L MLO tomo · tomo slice 47/94.0]
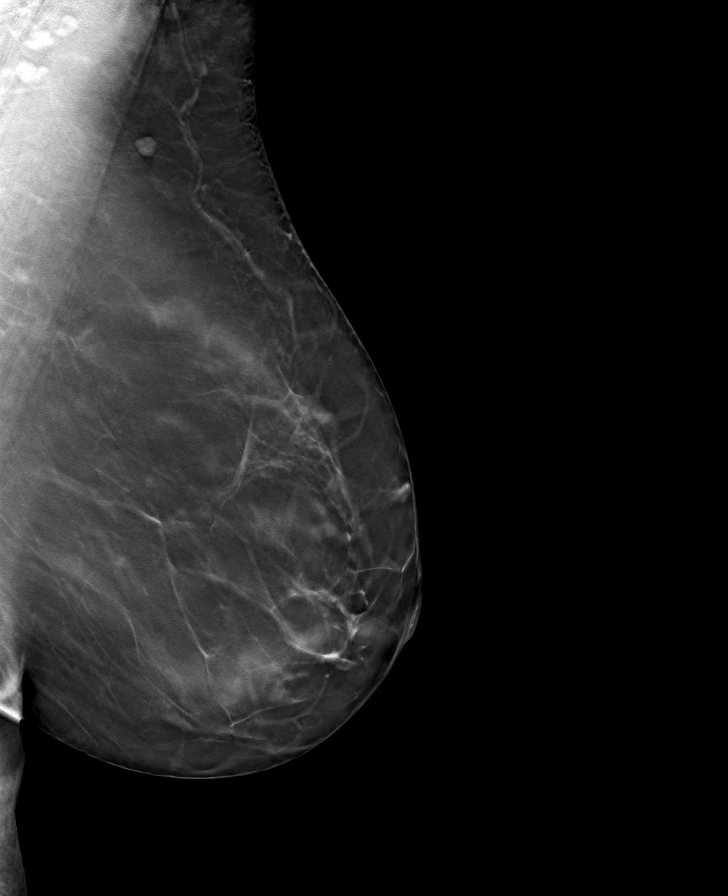

[R MLO tomo · tomo slice 49/96.0]
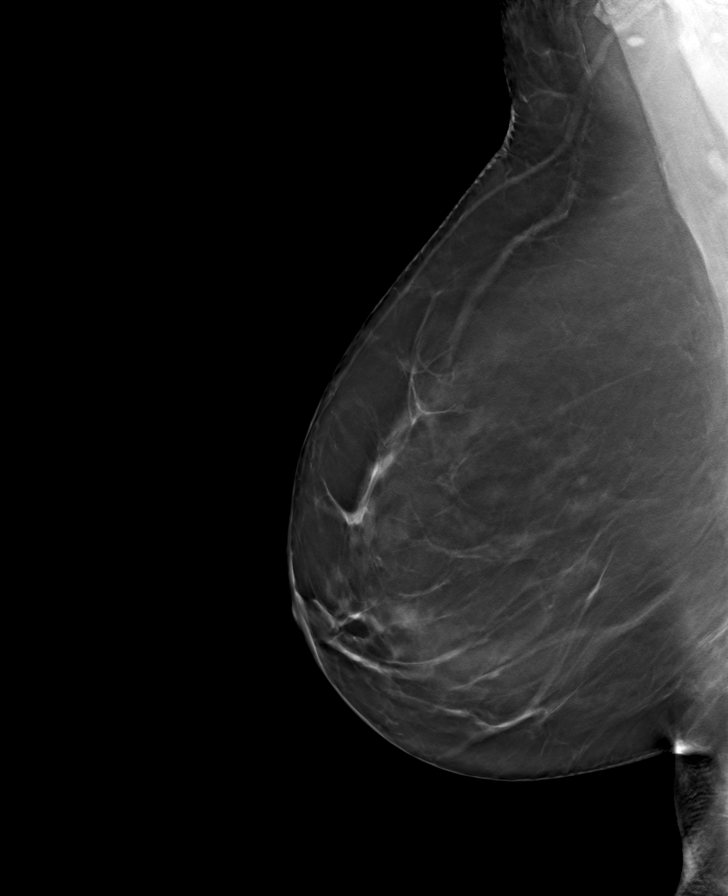

[L CC tomo · tomo slice 43/86.0]
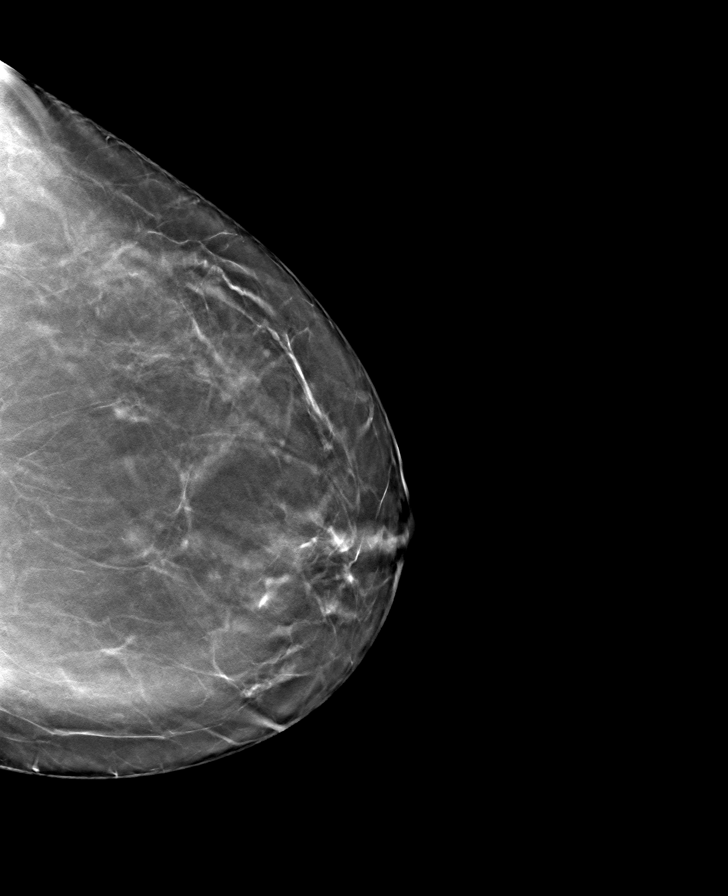

[8 of 24 positions shown; findings below may reference images not displayed]

ACR Breast Density Category b: There are scattered areas of
fibroglandular density.
FINDINGS: There are no findings suspicious for malignancy.
IMPRESSION: No mammographic evidence of malignancy. A result letter of this
screening mammogram will be mailed directly to the patient.

RECOMMENDATION:
Screening mammogram in one year. (Code:51-O-LD2)

BI-RADS CATEGORY  1: Negative.

## 2024-02-24 ENCOUNTER — Ambulatory Visit
Admission: RE | Admit: 2024-02-24 | Discharge: 2024-02-24 | Disposition: A | Discharge status: Home / Self Care | Source: Ambulatory Visit | Attending: Internal Medicine | Admitting: Internal Medicine

## 2024-02-24 DIAGNOSIS — Z1231 Encounter for screening mammogram for malignant neoplasm of breast: Secondary | ICD-10-CM | POA: Insufficient documentation

## 2024-04-29 ENCOUNTER — Ambulatory Visit: Admitting: Internal Medicine

## 2024-05-05 ENCOUNTER — Ambulatory Visit: Admitting: Internal Medicine

## 2024-05-22 ENCOUNTER — Ambulatory Visit: Admitting: Internal Medicine

## 2024-06-20 ENCOUNTER — Other Ambulatory Visit: Payer: Self-pay | Admitting: Internal Medicine

## 2024-06-22 NOTE — Telephone Encounter (Signed)
 Requested Prescriptions  Pending Prescriptions Disp Refills   estradiol  (ESTRACE ) 1 MG tablet [Pharmacy Med Name: ESTRADIOL  1 MG TABLET] 90 tablet 1    Sig: TAKE 1 TABLET BY MOUTH EVERY DAY     OB/GYN:  Estrogens Passed - 06/22/2024  1:06 PM      Passed - Mammogram is up-to-date per Health Maintenance      Passed - Last BP in normal range    BP Readings from Last 1 Encounters:  11/06/23 122/68         Passed - Valid encounter within last 12 months    Recent Outpatient Visits           7 months ago Encounter for general adult medical examination with abnormal findings   [facility] [place], Angeline ORN, NP

## 2024-09-11 ENCOUNTER — Ambulatory Visit: Admitting: Internal Medicine

## 2024-09-11 ENCOUNTER — Encounter: Payer: Self-pay | Admitting: Internal Medicine

## 2024-09-11 VITALS — BP 108/60 | HR 60 | Ht 62.0 in | Wt 125.8 lb

## 2024-09-11 DIAGNOSIS — B0229 Other postherpetic nervous system involvement: Secondary | ICD-10-CM

## 2024-09-11 MED ORDER — GABAPENTIN 100 MG PO CAPS: 100.0000 mg | ORAL_CAPSULE | Freq: Three times a day (TID) | ORAL | 0 refills | Status: AC

## 2024-09-11 MED ORDER — PREDNISONE 10 MG PO TABS: ORAL_TABLET | ORAL | 0 refills | Status: AC

## 2024-09-11 NOTE — Patient Instructions (Signed)
 Viral Infection That Causes a Skin Rash and Blisters (Shingles): What to Know  Shingles, or herpes zoster, is an infection. It gives you a skin rash and blisters. These infected areas may hurt a lot. Shingles only happens if: You've had chickenpox. You've been given a shot called a vaccine to protect you from getting chickenpox. Shingles is rare in this case. What are the causes? Shingles is caused by a germ called the varicella-zoster virus. This is the same germ that causes chickenpox. After you're exposed to the germ, it stays in your body but is dormant. This means it isn't active. Shingles happens if the germ becomes active again. This can happen years after you're first exposed to the germ. What increases the risk? You may be more likely to get shingles if: You're older than 65 years of age. You're under a lot of stress. You have a weak immune system. The immune system is your body's defense system. It may be weak if: You have human immunodeficiency virus (HIV). You have acquired immunodeficiency syndrome (AIDS). You have cancer. You take medicines that weaken your immune system. These include organ transplant medicines. What are the signs or symptoms? The first symptoms of shingles may be itching, tingling, or pain. Your skin may feel like it's burning. A few days or weeks later, you'll get a rash. Here's what you can expect: The rash is likely to be on one side of your body. The rash may be shaped like a belt or a band. Over time, it will turn into blisters filled with fluid. The blisters will break open and change into scabs. The scabs will dry up in about 2-3 weeks. You may also have: A fever. Chills. A headache. Nausea. How is this diagnosed? Shingles is diagnosed with a skin exam. A sample called a culture may be taken from one of your blisters and sent to a lab. This will show if you have shingles. How is this treated? The rash may last for several weeks. There's no cure  for shingles, but your health care provider may give you medicines. These medicines may: Help with pain. Help with itching. Help with irritation and swelling. Help you get better sooner. Help to prevent long-term problems. If the rash is on your face, you may need to see an eye doctor or an ear, nose, and throat (ENT) doctor. Follow these instructions at home: Medicines Take your medicines only as told by your provider. Put an anti-itch cream or numbing cream on the rash or blisters as told by your provider. Relieving itching and discomfort  To help with itching: Put cold, wet cloths called cold compresses on the rash or blisters. Take a cool bath. Try adding baking soda or dry oatmeal to the water. Do not bathe in hot water. Use calamine lotion on the rash or blisters. You can get this type of lotion at the store. Blister and rash care Keep your rash covered with a loose bandage. Wear loose clothes that don't rub on your rash. Take care of your rash as told by your provider. Make sure you: Wash your hands with soap and water for at least 20 seconds before and after you change your bandage. If you can't use soap and water, use hand sanitizer. Keep your rash and blisters clean by washing them with mild soap and cool water. Change your bandage. Check your rash every day for signs of infection. Check for: More redness, swelling, or pain. Fluid or blood. Warmth. Pus or a  bad smell. Do not scratch your rash. Do not pick at your blisters. To help you not scratch: Keep your fingernails clean and cut short. Try to wear gloves or mittens when you sleep. General instructions Rest. Wash your hands often with soap and water for at least 20 seconds. If you can't use soap and water, use hand sanitizer. Washing your hands lowers your chance of getting a skin infection. Your infection can cause chickenpox in others. If you have blisters that aren't scabs yet, stay away from: Babies. Pregnant  people. Children who have eczema. Older people who have organ transplants. People who have a long-term, or chronic, illness. Anyone who hasn't had chickenpox before. Anyone who hasn't gotten the chickenpox vaccine. How is this prevented? Vaccines are the best way to prevent you from getting chickenpox or shingles. Talk with your provider about getting these shots. Where to find more information Centers for Disease Control and Prevention (CDC): tonerpromos.no Contact a health care provider if: Your pain doesn't get better with medicine. Your pain doesn't get better after the rash heals. You have any signs of infection around the rash. Your rash or blisters get worse. You have a fever or chills. Get help right away if: The rash is on your face or nose. You have pain in your face or by your eye. You lose feeling on one side of your face. You have trouble seeing. You have ear pain or ringing in your ear. This information is not intended to replace advice given to you by your health care provider. Make sure you discuss any questions you have with your health care provider. Document Revised: 06/11/2024 Document Reviewed: 09/14/2022 Elsevier Patient Education  2025 [employer].

## 2024-09-11 NOTE — Progress Notes (Signed)
 "  Subjective:    Patient ID: Erica Robinson, female    DOB: Dec 10, 1960, 64 y.o.   MRN: [id number]  HPI  Discussed the use of AI scribe software for clinical note transcription with the patient, who gave verbal consent to proceed.  Erica Robinson is a 64 year old female who presents with persistent nerve pain following a shingles outbreak.  She was diagnosed with shingles while on a cruise last week and was treated with valcyclovir and prednisone . The rash was concentrated from the middle of her head to her shoulder, with sores under her hair and a patch behind her ear, which has now resolved. A patch under her chin also resolved quickly.  She describes persistent nerve pain as 'shooting' and notes that while on prednisone , the pain was better controlled. However, she feels the pain is worsening and describes it as 'relapsing'. She still has some open sores on her head, which are improving.  She is currently working from home and has developed a cold, which she is managing with over-the-counter medications.      Past Medical History:  Diagnosis Date   Bulging disc    L5    Current Outpatient Medications  Medication Sig Dispense Refill   Calcium-Vitamin D-Vitamin K 500-100-40 MG-UNT-MCG CHEW Chew by mouth.     Cholecalciferol (VITAMIN D3) 50 MCG (2000 UT) CHEW Chew by mouth.     estradiol  (ESTRACE ) 1 MG tablet TAKE 1 TABLET BY MOUTH EVERY DAY 90 tablet 1   Multiple Vitamin (MULTIVITAMIN) tablet Take 1 tablet by mouth daily.     Multiple Vitamins-Minerals (HAIR SKIN AND NAILS FORMULA PO) Take by mouth.     valACYclovir (VALTREX) 500 MG tablet Take 500 mg by mouth 2 (two) times daily.     No current facility-administered medications for this visit.    Allergies[1]  Family History  Problem Relation Age of Onset   Hyperlipidemia Mother    Heart disease Mother    Hypertension Mother    Kidney disease Mother    Diabetes Mother    Arthritis Mother    Hyperlipidemia Father     Colon polyps Father    Arthritis Father    Breast cancer Sister    Parkinson's disease Maternal Grandmother    Hyperlipidemia Sister    Obesity Sister    Hyperlipidemia Sister    Diabetes Sister    Obesity Sister    Colon cancer Neg Hx    Rectal cancer Neg Hx    Stomach cancer Neg Hx     Social History   Socioeconomic History   Marital status: Married    Spouse name: Not on file   Number of children: 2   Years of education: Not on file   Highest education level: Associate degree: academic program  Occupational History    Employer: [employer]  Tobacco Use   Smoking status: Never   Smokeless tobacco: Never  Vaping Use   Vaping status: Never Used  Substance and Sexual Activity   Alcohol use: No   Drug use: No   Sexual activity: Yes    Birth control/protection: Post-menopausal  Other Topics Concern   Not on file  Social History Narrative   Not on file   Social Drivers of Health   Tobacco Use: Low Risk (06/20/2024)   Received from [facility]   Patient History    Smoking Tobacco Use: Never    Smokeless Tobacco Use: Never    Passive Exposure:  Not on file  Financial Resource Strain: Low Risk (11/05/2023)   Overall Financial Resource Strain (CARDIA)    Difficulty of Paying Living Expenses: Not hard at all  Food Insecurity: No Food Insecurity (11/05/2023)   Hunger Vital Sign    Worried About Running Out of Food in the Last Year: Never true    Ran Out of Food in the Last Year: Never true  Transportation Needs: No Transportation Needs (11/05/2023)   PRAPARE - Administrator, Civil Service (Medical): No    Lack of Transportation (Non-Medical): No  Physical Activity: Insufficiently Active (11/05/2023)   Exercise Vital Sign    Days of Exercise per Week: 2 days    Minutes of Exercise per Session: 30 min  Stress: No Stress Concern Present (11/05/2023)   [employer] of Occupational Health - Occupational Stress Questionnaire     Feeling of Stress : Not at all  Social Connections: Socially Integrated (11/05/2023)   Social Connection and Isolation Panel    Frequency of Communication with Friends and Family: More than three times a week    Frequency of Social Gatherings with Friends and Family: Three times a week    Attends Religious Services: More than 4 times per year    Active Member of Clubs or Organizations: Yes    Attends Banker Meetings: More than 4 times per year    Marital Status: Married  Catering Manager Violence: Not on file  Depression (PHQ2-9): Low Risk (11/06/2023)   Depression (PHQ2-9)    PHQ-2 Score: 2  Alcohol Screen: Low Risk (11/05/2023)   Alcohol Screen    Last Alcohol Screening Score (AUDIT): 1  Housing: Unknown (11/05/2023)   Housing Stability Vital Sign    Unable to Pay for Housing in the Last Year: No    Number of Times Moved in the Last Year: Not on file    Homeless in the Last Year: No  Utilities: Not on file  Health Literacy: Not on file     Constitutional: Denies fever, malaise, fatigue, headache or abrupt weight changes.  HEENT: Denies eye pain, eye redness, ear pain, ringing in the ears, wax buildup, runny nose, nasal congestion, bloody nose, or sore throat. Respiratory: Denies difficulty breathing, shortness of breath, cough or sputum production.   Cardiovascular: Denies chest pain, chest tightness, palpitations or swelling in the hands or feet.  Gastrointestinal: Denies abdominal pain, bloating, constipation, diarrhea or blood in the stool.  GU: Denies urgency, frequency, pain with urination, burning sensation, blood in urine, odor or discharge. Musculoskeletal: Denies decrease in range of motion, difficulty with gait, muscle pain or joint pain and swelling.  Skin: Pt reports rash of scalp and upper back. Denies ulcerations.  Neurological: Denies dizziness, difficulty with memory, difficulty with speech or problems with balance and coordination.  Psych: Denies  anxiety, depression, SI/HI.  No other specific complaints in a complete review of systems (except as listed in HPI above).  Objective    BP 108/60 (Patient Position: Sitting, Cuff Size: Normal)   Pulse 60   Ht 5' 2 (1.575 m)   Wt 125 lb 12.8 oz (57.1 kg)   SpO2 99%   BMI 23.01 kg/m   Wt Readings from Last 3 Encounters:  09/11/24 125 lb 12.8 oz (57.1 kg)  11/06/23 159 lb 6.4 oz (72.3 kg)  06/27/22 138 lb (62.6 kg)    General: Appears her stated age, well developed, well nourished in NAD. Skin: Warm, dry and intact.  Scabbed lesions noted  of right posterior scalp. HEENT: Head: normal shape and size; Eyes: sclera white, no icterus, conjunctiva pink, PERRLA and EOMs intact; Cardiovascular: Normal rate. Pulmonary/Chest: Normal effort. No respiratory distress.  Neurological: Alert and oriented.   BMET    Component Value Date/Time   NA 140 11/06/2023 0902   K 4.2 11/06/2023 0902   CL 103 11/06/2023 0902   CO2 29 11/06/2023 0902   GLUCOSE 88 11/06/2023 0902   BUN 16 11/06/2023 0902   CREATININE 0.70 11/06/2023 0902   CALCIUM 9.1 11/06/2023 0902    Lipid Panel     Component Value Date/Time   CHOL 260 (H) 11/06/2023 0902   TRIG 127 11/06/2023 0902   HDL 90 11/06/2023 0902   CHOLHDL 2.9 11/06/2023 0902   VLDL 11.2 11/03/2020 0850   LDLCALC 145 (H) 11/06/2023 0902    CBC    Component Value Date/Time   WBC 3.9 11/06/2023 0902   RBC 4.32 11/06/2023 0902   HGB 13.0 11/06/2023 0902   HCT 38.9 11/06/2023 0902   PLT 246 11/06/2023 0902   MCV 90.0 11/06/2023 0902   MCH 30.1 11/06/2023 0902   MCHC 33.4 11/06/2023 0902   RDW 12.4 11/06/2023 0902    Hgb A1C Lab Results  Component Value Date   HGBA1C 5.7 (H) 11/06/2023       Assessment and Plan   Assessment and Plan    Herpes zoster with postherpetic neuralgia Persistent nerve pain suggests postherpetic neuralgia. Pain worsening despite initial treatment indicates possible relapse or progression. Gabapentin   considered for nerve pain management with caution for sedative effects. - Prescribed prednisone  taper: 30 mg for 3 days, 20 mg for 3 days, 10 mg for 3 days. - Prescribed gabapentin  100 mg three times a day for nerve pain. - Advised taking gabapentin  at night initially to assess sedative effects. - Discussed potential for postherpetic neuralgia and its management.      RTC in 2 months for annual exam  Angeline Laura, NP     [1]  Allergies Allergen Reactions   Celebrex [Celecoxib] Other (See Comments)    Swelling of joints   Morphine And Codeine Other (See Comments)    Severe migraines   "

## 2024-11-06 ENCOUNTER — Encounter: Admitting: Internal Medicine
